# Patient Record
Sex: Male | Born: 1995 | Race: White | Hispanic: No | Marital: Single | Smoking: Former smoker
Health system: Southern US, Community
[De-identification: ages and names within clinical notes are randomized; demographics above are authoritative.]

## PROBLEM LIST (undated history)

## (undated) DIAGNOSIS — J302 Other seasonal allergic rhinitis: Secondary | ICD-10-CM

## (undated) DIAGNOSIS — J45909 Unspecified asthma, uncomplicated: Secondary | ICD-10-CM

## (undated) DIAGNOSIS — F909 Attention-deficit hyperactivity disorder, unspecified type: Secondary | ICD-10-CM

## (undated) DIAGNOSIS — M941 Relapsing polychondritis: Secondary | ICD-10-CM

## (undated) DIAGNOSIS — K900 Celiac disease: Secondary | ICD-10-CM

## (undated) DIAGNOSIS — R1310 Dysphagia, unspecified: Secondary | ICD-10-CM

## (undated) HISTORY — DX: Dysphagia, unspecified: R13.10

## (undated) HISTORY — DX: Relapsing polychondritis: M94.1

---
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
## 2017-07-10 ENCOUNTER — Encounter (HOSPITAL_COMMUNITY): Payer: Self-pay | Admitting: Emergency Medicine

## 2017-07-10 ENCOUNTER — Ambulatory Visit (HOSPITAL_COMMUNITY)
Admission: EM | Admit: 2017-07-10 | Discharge: 2017-07-10 | Disposition: A | Discharge status: Home / Self Care | Payer: 59 | Attending: Family Medicine | Admitting: Family Medicine

## 2017-07-10 ENCOUNTER — Other Ambulatory Visit: Payer: Self-pay

## 2017-07-10 DIAGNOSIS — K219 Gastro-esophageal reflux disease without esophagitis: Secondary | ICD-10-CM | POA: Insufficient documentation

## 2017-07-10 DIAGNOSIS — K900 Celiac disease: Secondary | ICD-10-CM | POA: Diagnosis not present

## 2017-07-10 DIAGNOSIS — Z79899 Other long term (current) drug therapy: Secondary | ICD-10-CM | POA: Diagnosis not present

## 2017-07-10 DIAGNOSIS — Z87891 Personal history of nicotine dependence: Secondary | ICD-10-CM | POA: Insufficient documentation

## 2017-07-10 DIAGNOSIS — Z825 Family history of asthma and other chronic lower respiratory diseases: Secondary | ICD-10-CM | POA: Insufficient documentation

## 2017-07-10 DIAGNOSIS — F909 Attention-deficit hyperactivity disorder, unspecified type: Secondary | ICD-10-CM | POA: Diagnosis not present

## 2017-07-10 DIAGNOSIS — Z8249 Family history of ischemic heart disease and other diseases of the circulatory system: Secondary | ICD-10-CM | POA: Diagnosis not present

## 2017-07-10 DIAGNOSIS — Z113 Encounter for screening for infections with a predominantly sexual mode of transmission: Secondary | ICD-10-CM | POA: Diagnosis not present

## 2017-07-10 DIAGNOSIS — J45909 Unspecified asthma, uncomplicated: Secondary | ICD-10-CM | POA: Diagnosis not present

## 2017-07-10 NOTE — Discharge Instructions (Signed)
We have sent testing for sexually transmitted infections. We will notify you of any positive results once they are received. If required, we will prescribe any medications you might need.  For your acid reflux you may start taking Zantac 150mg  once or twice daily.

## 2017-07-10 NOTE — ED Provider Notes (Signed)
[facility]ARE CENTER   [id number] 07/10/17 Arrival Time: 1134  ASSESSMENT & PLAN:  1. Screen for STD (sexually transmitted disease)   2. Gastroesophageal reflux disease without esophagitis    OTC Zantac for GERD.  Pending: Labs Reviewed  HIV ANTIBODY (ROUTINE TESTING)  RPR  URINE CYTOLOGY ANCILLARY ONLY    Will notify of any positive results.  May f/u with PCP or here as needed. Reviewed expectations re: course of current medical issues. Questions answered. Outlined signs and symptoms indicating need for more acute intervention. Patient verbalized understanding. After Visit Summary given.   SUBJECTIVE:  Melvin Mitchell is a 22 y.o. male who requests STD screening. Reports unprotected intercourse with male partner recently. No symptoms. Afebrile. No abdominal or pelvic pain. No n/v. No rashes or lesions. No h/o STDs reported. Also reports acid reflux symptoms on/off for many months. No self treatment. "Heartburn" after eating large meal. Belching more. No abdominal pain currently.  ROS: As per HPI.  OBJECTIVE:  Vitals:   07/10/17 1208  BP: (!) 126/50  Pulse: 83  Temp: 98.2 F (36.8 C)  TempSrc: Oral  SpO2: 99%     General appearance: alert, cooperative, appears stated age and no distress Throat: lips, mucosa, and tongue normal; teeth and gums normal Back: no CVA tenderness Abdomen: soft, non-tender; bowel sounds normal; no masses or organomegaly GU: declines Skin: warm and dry Psychological:  Alert and cooperative. Normal mood and affect.  Labs Reviewed  HIV ANTIBODY (ROUTINE TESTING)  RPR  URINE CYTOLOGY ANCILLARY ONLY    No Known Allergies  Past Medical History:  Diagnosis Date  . ADHD (attention deficit hyperactivity disorder)   . Asthma    triggered by sensoanl allergies  . Celiac disease   . Dysphagia    Started with choking incident at school  . Seasonal allergies    Spring   Family History  Problem Relation Age of Onset  . Asthma  Sister   . Hyperlipidemia Mother   . Hypertension Mother   . Hyperlipidemia Maternal Grandfather   . Hypertension Maternal Grandfather   . Heart disease Paternal Grandfather    Social History   Socioeconomic History  . Marital status: Single    Spouse name: Not on file  . Number of children: Not on file  . Years of education: Not on file  . Highest education level: Not on file  Occupational History  . Not on file  Social Needs  . Financial resource strain: Not on file  . Food insecurity:    Worry: Not on file    Inability: Not on file  . Transportation needs:    Medical: Not on file    Non-medical: Not on file  Tobacco Use  . Smoking status: Former Smoker    Last attempt to quit: 08/30/2015    Years since quitting: 1.8  . Smokeless tobacco: Former Neurosurgeon    Types: Chew    Quit date: 09/20/2014  Substance and Sexual Activity  . Alcohol use: Yes  . Drug use: No  . Sexual activity: Yes    Birth control/protection: Condom  Lifestyle  . Physical activity:    Days per week: Not on file    Minutes per session: Not on file  . Stress: Not on file  Relationships  . Social connections:    Talks on phone: Not on file    Gets together: Not on file    Attends religious service: Not on file    Active member of club or  organization: Not on file    Attends meetings of clubs or organizations: Not on file    Relationship status: Not on file  . Intimate partner violence:    Fear of current or ex partner: Not on file    Emotionally abused: Not on file    Physically abused: Not on file    Forced sexual activity: Not on file  Other Topics Concern  . Not on file  Social History Narrative   8th grade                   Mardella LaymanHagler, Orah Sonnen, MD 07/10/17 1226

## 2017-07-10 NOTE — ED Triage Notes (Signed)
Pt has celiac disease and has been having acid reflux issues for several months.  Pt also admits to unprotected sex with a girl about a month ago.  He states he has some redness and irritation at the tip of his penis.  He denies any other urinary issues, discharge or dysuria.

## 2017-07-11 LAB — HIV ANTIBODY (ROUTINE TESTING W REFLEX): HIV SCREEN 4TH GENERATION: NONREACTIVE

## 2017-07-11 LAB — URINE CYTOLOGY ANCILLARY ONLY
CHLAMYDIA, DNA PROBE: POSITIVE — AB
Neisseria Gonorrhea: NEGATIVE
Trichomonas: NEGATIVE

## 2017-07-11 LAB — RPR: RPR: NONREACTIVE

## 2017-07-12 ENCOUNTER — Telehealth: Payer: Self-pay | Admitting: Internal Medicine

## 2017-07-12 MED ORDER — AZITHROMYCIN 500 MG PO TABS: 1000.0000 mg | ORAL_TABLET | Freq: Once | ORAL | 0 refills | Status: AC

## 2017-07-12 NOTE — Telephone Encounter (Signed)
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  Prescription for zithromax sent to the pharmacy of record, [facility] on [address]..  Please refrain from sexual intercourse for 7 days to give the medicine time to work.  Sexual partners need to be notified and tested/treated.  Condoms may reduce risk of reinfection.  Recheck or followup with your primary care provider for further evaluation if symptoms are not improving.   LM

## 2017-07-22 ENCOUNTER — Telehealth (HOSPITAL_COMMUNITY): Payer: Self-pay

## 2017-07-22 NOTE — Telephone Encounter (Signed)
Attempt to call patient regarding test results from recent visit and need for new medication. No answer and voicemail not available. Will continue to try to reach patient.

## 2017-07-24 ENCOUNTER — Telehealth (HOSPITAL_COMMUNITY): Payer: Self-pay

## 2017-07-24 NOTE — Telephone Encounter (Signed)
Pt returned call regarding test results. Educated on positive results, need to pick up new prescription and safe sex practices.

## 2017-10-13 ENCOUNTER — Ambulatory Visit: Payer: 59 | Admitting: Adult Health

## 2018-12-30 ENCOUNTER — Ambulatory Visit: Payer: 59 | Admitting: Family Medicine

## 2018-12-30 DIAGNOSIS — Z0289 Encounter for other administrative examinations: Secondary | ICD-10-CM

## 2019-02-23 ENCOUNTER — Emergency Department (HOSPITAL_COMMUNITY): Payer: 59

## 2019-02-23 ENCOUNTER — Encounter (HOSPITAL_COMMUNITY): Payer: Self-pay

## 2019-02-23 ENCOUNTER — Emergency Department (HOSPITAL_COMMUNITY)
Admission: EM | Admit: 2019-02-23 | Discharge: 2019-02-23 | Disposition: A | Discharge status: Home / Self Care | Payer: 59 | Attending: Emergency Medicine | Admitting: Emergency Medicine

## 2019-02-23 ENCOUNTER — Other Ambulatory Visit: Payer: Self-pay

## 2019-02-23 DIAGNOSIS — Y939 Activity, unspecified: Secondary | ICD-10-CM | POA: Diagnosis not present

## 2019-02-23 DIAGNOSIS — J45909 Unspecified asthma, uncomplicated: Secondary | ICD-10-CM | POA: Diagnosis not present

## 2019-02-23 DIAGNOSIS — W010XXA Fall on same level from slipping, tripping and stumbling without subsequent striking against object, initial encounter: Secondary | ICD-10-CM | POA: Diagnosis not present

## 2019-02-23 DIAGNOSIS — M62838 Other muscle spasm: Secondary | ICD-10-CM | POA: Insufficient documentation

## 2019-02-23 DIAGNOSIS — Y999 Unspecified external cause status: Secondary | ICD-10-CM | POA: Insufficient documentation

## 2019-02-23 DIAGNOSIS — Y929 Unspecified place or not applicable: Secondary | ICD-10-CM | POA: Insufficient documentation

## 2019-02-23 DIAGNOSIS — Z79899 Other long term (current) drug therapy: Secondary | ICD-10-CM | POA: Diagnosis not present

## 2019-02-23 DIAGNOSIS — Z87891 Personal history of nicotine dependence: Secondary | ICD-10-CM | POA: Insufficient documentation

## 2019-02-23 LAB — URINALYSIS, ROUTINE W REFLEX MICROSCOPIC
Bilirubin Urine: NEGATIVE
Glucose, UA: NEGATIVE mg/dL
Hgb urine dipstick: NEGATIVE
Ketones, ur: NEGATIVE mg/dL
Leukocytes,Ua: NEGATIVE
Nitrite: NEGATIVE
Protein, ur: NEGATIVE mg/dL
Specific Gravity, Urine: 1.02 (ref 1.005–1.030)
pH: 8 (ref 5.0–8.0)

## 2019-02-23 MED ORDER — METHOCARBAMOL 500 MG PO TABS: 500.0000 mg | ORAL_TABLET | Freq: Four times a day (QID) | ORAL | 0 refills | Status: DC

## 2019-02-23 MED ORDER — HYDROCODONE-ACETAMINOPHEN 5-325 MG PO TABS
2.0000 | ORAL_TABLET | Freq: Once | ORAL | Status: AC
  Administered 2019-02-23: 2 via ORAL
  Filled 2019-02-23: qty 2

## 2019-02-23 MED ORDER — HYDROCODONE-ACETAMINOPHEN 5-325 MG PO TABS: 2.0000 | ORAL_TABLET | Freq: Four times a day (QID) | ORAL | 0 refills | Status: DC | PRN
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
## 2019-02-23 NOTE — ED Notes (Signed)
Pt unable to sign for DC due to broken pad

## 2019-02-23 NOTE — ED Notes (Signed)
Pt attempting to obtain UA sample

## 2019-02-23 NOTE — ED Notes (Signed)
Pt requesting water and medication for headache. PA Aware

## 2019-02-23 NOTE — ED Triage Notes (Signed)
Per [facility] pt from home for muscle spasms in back that caused him to fall. Having back pains. Denies LOC. Vitals: 126/80, 72Hr, 99% on RA and temp 97.8. pt has on c-collar.

## 2019-02-23 NOTE — Discharge Instructions (Signed)
Return if any problems. See your Orthopaedist for recheck in 3-4 days 

## 2019-02-23 NOTE — ED Notes (Signed)
Pt unable to walk at this time due to pain. Pt administered pain medication. Will attempt to ambulate after pain medication

## 2019-02-23 NOTE — ED Notes (Signed)
Pt was able to ambulate in hallway. Pt reported continued pain but was able to ambulate. PA made aware

## 2019-02-23 NOTE — ED Notes (Signed)
C Collar removed by EDP 

## 2019-06-18 ENCOUNTER — Encounter: Payer: Self-pay | Admitting: Family Medicine

## 2019-06-18 ENCOUNTER — Other Ambulatory Visit: Payer: Self-pay

## 2019-06-18 ENCOUNTER — Ambulatory Visit (INDEPENDENT_AMBULATORY_CARE_PROVIDER_SITE_OTHER): Payer: 59 | Admitting: Family Medicine

## 2019-06-18 VITALS — BP 96/72 | HR 76 | Temp 98.8°F | Resp 10 | Ht 70.0 in | Wt 133.2 lb

## 2019-06-18 DIAGNOSIS — K900 Celiac disease: Secondary | ICD-10-CM

## 2019-06-18 DIAGNOSIS — Z113 Encounter for screening for infections with a predominantly sexual mode of transmission: Secondary | ICD-10-CM | POA: Diagnosis not present

## 2019-06-18 LAB — CBC WITH DIFFERENTIAL/PLATELET
Basophils Absolute: 0 10*3/uL (ref 0.0–0.1)
Basophils Relative: 0.8 % (ref 0.0–3.0)
Eosinophils Absolute: 0.1 10*3/uL (ref 0.0–0.7)
Eosinophils Relative: 2.7 % (ref 0.0–5.0)
HCT: 42.4 % (ref 39.0–52.0)
Hemoglobin: 14.7 g/dL (ref 13.0–17.0)
Lymphocytes Relative: 26.1 % (ref 12.0–46.0)
Lymphs Abs: 1.4 10*3/uL (ref 0.7–4.0)
MCHC: 34.7 g/dL (ref 30.0–36.0)
MCV: 92.3 fl (ref 78.0–100.0)
Monocytes Absolute: 0.5 10*3/uL (ref 0.1–1.0)
Monocytes Relative: 9.3 % (ref 3.0–12.0)
Neutro Abs: 3.2 10*3/uL (ref 1.4–7.7)
Neutrophils Relative %: 61.1 % (ref 43.0–77.0)
Platelets: 235 10*3/uL (ref 150.0–400.0)
RBC: 4.59 Mil/uL (ref 4.22–5.81)
RDW: 12.5 % (ref 11.5–15.5)
WBC: 5.3 10*3/uL (ref 4.0–10.5)

## 2019-06-18 LAB — FERRITIN: Ferritin: 77.8 ng/mL (ref 22.0–322.0)

## 2019-06-18 NOTE — Progress Notes (Signed)
HPI   Patient presents today for annual physical and to establish care.  Just got back from [facility].  Likes to throw the football with friends and workout every other day.  Patient has a history of celiacs and states that is getting better about eating.  He states that it has been a month since having any gluten.  Patient states that he drinks soda and sweet tea daily.  Admits having alcohol once to twice a week, 2-3 drinks a setting.  Patient admits that he is sexually active and is okay to have STI exam.  Patient states that he is trying to stop vaping, but reports that he had a puff today.  BP 96/72   Pulse 76   Temp 98.8 F (37.1 C)   Resp 10   Ht 5' 10"  (1.778 m)   Wt 133 lb 4 oz (60.4 kg)   SpO2 97%   BMI 19.12 kg/m   Review of Systems  Constitutional: Negative.   HENT: Negative.   Eyes: Negative.   Respiratory: Negative.   Cardiovascular: Negative.   Gastrointestinal: Positive for heartburn.  Genitourinary: Negative.   Musculoskeletal: Positive for back pain.  Skin: Negative.   Neurological: Negative.   Endo/Heme/Allergies: Positive for environmental allergies.  Psychiatric/Behavioral: Positive for substance abuse.   Plan  Will order a lipid pannel, STI, CBC for today.  Will follow up with patient with results.

## 2019-06-18 NOTE — Patient Instructions (Signed)

## 2019-06-18 NOTE — Progress Notes (Signed)
   Subjective:    Patient ID: Melvin Mitchell, male    DOB: 09-29-1995, 24 y.o.   MRN: [id number]  HPI Chief Complaint  Patient presents with  . Establish Care    previous PCP Mellody Dance  . Annual Exam   This is a 24 yo male who presents today to establish care.  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 Wanting to go to [employer] to study firefighting. Has a dog. Lives alone.   Last CPE- Tdap- Flu- Dental- Eye- Exercise- Vaping- currently trying to quit. Has quit in the past.     Review of Systems     Objective:   Physical Exam    BP 96/72   Pulse 76   Temp 98.8 F (37.1 C)   Resp 10   Ht 5' 10"  (1.778 m)   Wt 133 lb 4 oz (60.4 kg)   SpO2 97%   BMI 19.12 kg/m      Assessment & Plan:

## 2019-06-21 LAB — RPR: RPR Ser Ql: NONREACTIVE

## 2019-06-21 LAB — C. TRACHOMATIS/N. GONORRHOEAE RNA
C. trachomatis RNA, TMA: NOT DETECTED
N. gonorrhoeae RNA, TMA: NOT DETECTED

## 2019-06-21 LAB — HIV ANTIBODY (ROUTINE TESTING W REFLEX): HIV 1&2 Ab, 4th Generation: NONREACTIVE

## 2019-09-08 ENCOUNTER — Other Ambulatory Visit: Payer: Self-pay

## 2019-09-08 ENCOUNTER — Encounter: Payer: Self-pay | Admitting: Emergency Medicine

## 2019-09-08 ENCOUNTER — Ambulatory Visit
Admission: EM | Admit: 2019-09-08 | Discharge: 2019-09-08 | Disposition: A | Discharge status: Home / Self Care | Payer: 59

## 2019-09-08 DIAGNOSIS — H00012 Hordeolum externum right lower eyelid: Secondary | ICD-10-CM | POA: Diagnosis not present

## 2019-09-08 MED ORDER — ERYTHROMYCIN 5 MG/GM OP OINT: TOPICAL_OINTMENT | OPHTHALMIC | 0 refills | Status: DC

## 2019-09-08 NOTE — Discharge Instructions (Addendum)
Erythromycin ointment as directed. Lid scrubs and warm compresses as directed. Monitor for any worsening of symptoms, changes in vision, sensitivity to light, eye swelling, painful eye movement, follow up with ophthalmology for further evaluation.   

## 2019-09-08 NOTE — ED Triage Notes (Signed)
Patient is wearing contacts

## 2019-09-08 NOTE — ED Triage Notes (Signed)
Right lower lid is swelling and is painful.  Redness noted to right lower eye lid.  No known injury.  Symptoms started yesterday

## 2019-09-09 NOTE — ED Provider Notes (Signed)
[name]    CSN: [id number] Arrival date & time: 09/08/19  6761      History   Chief Complaint Chief Complaint  Patient presents with  . Eye Problem    HPI Melvin Mitchell is a 24 y.o. male.   23 year old male comes in for 2 day history of right lower eyelid swelling and pain. States now with some redness to the eyelid. Denies conjunctival injection, vision changes, photophobia. Denies injury/trauma. Wears contacts, glasses.      Past Medical History:  Diagnosis Date  . ADHD (attention deficit hyperactivity disorder)   . Asthma    triggered by sensoanl allergies  . Celiac disease   . Dysphagia    Started with choking incident at school  . Seasonal allergies    Spring    Patient Active Problem List   Diagnosis Date Noted  . Asthma 09/21/2015  . Acute rhinosinusitis 09/20/2015  . Cough 09/20/2015  . Right Monteggia fracture 06/26/2014  . Celiac disease 04/16/2012  . Difficulty swallowing     Past Surgical History:  Procedure Laterality Date  . ESOPHAGOGASTRODUODENOSCOPY  03/27/2012   Procedure: ESOPHAGOGASTRODUODENOSCOPY (EGD);  Surgeon: Oletha Blend, MD;  Location: [facility];  Service: Gastroenterology;  Laterality: N/A;  possible dilatation  . ORIF ELBOW FRACTURE Right 06/27/2014   Procedure: OPEN REDUCTION INTERNAL FIXATION (ORIF) ELBOW/OLECRANON FRACTURE;  Surgeon: Iran Planas, MD;  Location: [facility];  Service: Orthopedics;  Laterality: Right;       Home Medications    Prior to Admission medications   Medication Sig Start Date End Date Taking? Authorizing Provider  cetirizine (ZYRTEC) 10 MG tablet Take 10 mg by mouth daily.   Yes [provider]  erythromycin ophthalmic ointment Place a 1/2 inch ribbon of ointment to affected area 4 times a day for 7 days 09/08/19   Ok Edwards, PA-C  Melatonin 1 MG TABS Take by mouth. As needed, not sure of the exact mg-dose    [provider]    Family History Family History  Problem  Relation Age of Onset  . Hyperlipidemia Mother   . Hypertension Mother   . Diabetes Mother   . Hyperlipidemia Maternal Grandfather   . Hypertension Maternal Grandfather   . Stroke Maternal Grandfather   . Heart disease Paternal Grandfather   . Heart attack Paternal Grandfather   . Dementia Maternal Grandmother     Social History Social History   Tobacco Use  . Smoking status: Former Smoker    Packs/day: 0.25    Types: Cigarettes    Quit date: 08/30/2015    Years since quitting: 4.0  . Smokeless tobacco: Former Systems developer    Types: Chew    Quit date: 09/20/2014  . Tobacco comment: off and on  Substance Use Topics  . Alcohol use: Yes    Comment: once to twice a week  . Drug use: Yes    Types: Marijuana     Allergies   Patient has no known allergies.   Review of Systems Review of Systems  Reason unable to perform ROS: See HPI as above.     Physical Exam Triage Vital Signs ED Triage Vitals  Enc Vitals Group     BP 09/08/19 0944 113/72     Pulse Rate 09/08/19 0944 65     Resp 09/08/19 0944 16     Temp 09/08/19 0944 97.8 F (36.6 C)     Temp Source 09/08/19 0944 Oral     SpO2  09/08/19 0944 99 %     Weight --      Height --      Head Circumference --      Peak Flow --      Pain Score 09/08/19 0941 5     Pain Loc --      Pain Edu? --      Excl. in [place]? --    No data found.  Updated Vital Signs BP 113/72 (BP Location: Left Arm)   Pulse 65   Temp 97.8 F (36.6 C) (Oral)   Resp 16   SpO2 99%   Physical Exam Constitutional:      General: He is not in acute distress.    Appearance: He is well-developed.  HENT:     Head: Normocephalic and atraumatic.  Eyes:     Comments: Hordeolum to the nasal aspect of right lower eyelid. Surrounding swelling with slight erythema to the eyelid. No warmth, induration. Full EOM without pain. Conjunctiva clear. PERRL  Musculoskeletal:     Cervical back: Normal range of motion and neck supple.  Skin:    General: Skin is warm  and dry.  Neurological:     Mental Status: He is alert and oriented to person, place, and time.      UC Treatments / Results  Labs (all labs ordered are listed, but only abnormal results are displayed) Labs Reviewed - No data to display  EKG   Radiology No results found.  Procedures Procedures (including critical care time)  Medications Ordered in UC Medications - No data to display  Initial Impression / Assessment and Plan / UC Course  I have reviewed the triage vital signs and the nursing notes.  Pertinent labs & imaging results that were available during my care of the patient were reviewed by me and considered in my medical decision making (see chart for details).    No signs of preseptal cellulitis at this time. Will start erythromycin ointment. Lids scrubs and warm compress as directed. Return precautions given. Patient expresses understanding and agrees to plan.  Final Clinical Impressions(s) / UC Diagnoses   Final diagnoses:  Hordeolum externum of right lower eyelid    ED Prescriptions    Medication Sig Dispense Auth. Provider   erythromycin ophthalmic ointment Place a 1/2 inch ribbon of ointment to affected area 4 times a day for 7 days 1 g Ok Edwards, PA-C     PDMP not reviewed this encounter.   Ok Edwards, PA-C 09/09/19 [phone]

## 2019-10-20 ENCOUNTER — Other Ambulatory Visit: Payer: Self-pay

## 2019-10-20 ENCOUNTER — Encounter: Payer: Self-pay | Admitting: Emergency Medicine

## 2019-10-20 ENCOUNTER — Ambulatory Visit
Admission: EM | Admit: 2019-10-20 | Discharge: 2019-10-20 | Disposition: A | Discharge status: Home / Self Care | Payer: 59 | Attending: Emergency Medicine | Admitting: Emergency Medicine

## 2019-10-20 DIAGNOSIS — Z113 Encounter for screening for infections with a predominantly sexual mode of transmission: Secondary | ICD-10-CM

## 2019-10-20 NOTE — Discharge Instructions (Addendum)
Give [place] a working phone number so that we can contact you if needed. Refrain from sexual contact until you know your results and your partner(s) are treated if necessary.   Go to www.goodrx.com to look up your medications. This will give you a list of where you can find your prescriptions at the most affordable prices. Or ask the pharmacist what the cash price is, or if they have any other discount programs available to help make your medication more affordable. This can be less expensive than what you would pay with insurance.    Below is a list of primary care practices who are taking new patients for you to follow-up with.  [facility] internal medicine clinic Ground Floor - [facility], [place], [place], [place] [address] [phone]  [facility] at [facility] [address] [place], [place] [address] [phone]  [facility] [place], [place] [address] [phone]  [facility] [phone] [place] [address]  [facility]: [place]  [phone]  [facility]: [place]   [phone]  [facility]: [address] [place]  [phone]  [facility] primary [facility] : [address] [phone]  [facility]: [address] [place] [phone] [phone]  [facility]: [place] [phone]  Dr. Blanchie Serve [place]  [phone]  Dr. Benito Mccreedy, [facility]. [place], [place] [address]  [phone]

## 2019-10-20 NOTE — ED Notes (Signed)
Patient able to ambulate independently  

## 2019-10-20 NOTE — ED Triage Notes (Addendum)
PT presents to [facility] for assessment after having unprotected sex 1 week ago.  Would like STI testing. Denies symptoms.  Denies concerns for HIV or Syphilis.

## 2019-10-20 NOTE — ED Provider Notes (Signed)
HPI  SUBJECTIVE:  Melvin Mitchell is a 24 y.o. male who presents for STD screening.  He has no complaints.  He is requesting testing for gonorrhea, chlamydia, trichomonas.  States that he had unprotected sex with an old girlfriend last week.  He does not know if she is having any symptoms.  He denies having any other partners.  He denies dysuria, urinary complaints, penile rash, discharge, testicular or scrotal pain, swelling, nausea, vomiting, body aches, fevers, back, abdominal, pelvic pain.  He has a past medical history of chlamydia.  No history of gonorrhea, HIV, HSV, syphilis, trichomonas, diabetes.  PMD: None.  Past Medical History:  Diagnosis Date  . ADHD (attention deficit hyperactivity disorder)   . Asthma    triggered by sensoanl allergies  . Celiac disease   . Dysphagia    Started with choking incident at school  . Seasonal allergies    Spring    Past Surgical History:  Procedure Laterality Date  . ESOPHAGOGASTRODUODENOSCOPY  03/27/2012   Procedure: ESOPHAGOGASTRODUODENOSCOPY (EGD);  Surgeon: Oletha Blend, MD;  Location: [place];  Service: Gastroenterology;  Laterality: N/A;  possible dilatation  . ORIF ELBOW FRACTURE Right 06/27/2014   Procedure: OPEN REDUCTION INTERNAL FIXATION (ORIF) ELBOW/OLECRANON FRACTURE;  Surgeon: Iran Planas, MD;  Location: [place];  Service: Orthopedics;  Laterality: Right;    Family History  Problem Relation Age of Onset  . Hyperlipidemia Mother   . Hypertension Mother   . Diabetes Mother   . Hyperlipidemia Maternal Grandfather   . Hypertension Maternal Grandfather   . Stroke Maternal Grandfather   . Heart disease Paternal Grandfather   . Heart attack Paternal Grandfather   . Dementia Maternal Grandmother     Social History   Tobacco Use  . Smoking status: Former Smoker    Packs/day: 0.25    Types: Cigarettes    Quit date: 08/30/2015    Years since quitting: 4.1  . Smokeless tobacco: Former Systems developer    Types: Chew    Quit date:  09/20/2014  . Tobacco comment: off and on  Vaping Use  . Vaping Use: Former  . Quit date: 06/09/2019  Substance Use Topics  . Alcohol use: Yes    Comment: once to twice a week  . Drug use: Yes    Types: Marijuana    No current facility-administered medications for this encounter.  Current Outpatient Medications:  .  cetirizine (ZYRTEC) 10 MG tablet, Take 10 mg by mouth daily., Disp: , Rfl:  .  erythromycin ophthalmic ointment, Place a 1/2 inch ribbon of ointment to affected area 4 times a day for 7 days, Disp: 1 g, Rfl: 0 .  Melatonin 1 MG TABS, Take by mouth. As needed, not sure of the exact mg-dose, Disp: , Rfl:   No Known Allergies   ROS  As noted in HPI.   Physical Exam  BP 113/65 (BP Location: Left Arm)   Pulse 77   Temp 98.4 F (36.9 C) (Oral)   Resp 16   SpO2 95%   Constitutional: Well developed, well nourished, no acute distress Eyes:  EOMI, conjunctiva normal bilaterally HENT: Normocephalic, atraumatic,mucus membranes moist Respiratory: Normal inspiratory effort Cardiovascular: Normal rate GI: nondistended. No suprapubic tenderness Back: No CVAT GU: Declined skin: No rash, skin intact Musculoskeletal: no deformities Neurologic: Alert & oriented x 3, no focal neuro deficits Psychiatric: Speech and behavior appropriate   ED Course   Medications - No data to display  No orders of the defined types were  placed in this encounter.   No results found for this or any previous visit (from the past 24 hour(s)). No results found.  ED Clinical Impression  1. Screening examination for STD (sexually transmitted disease)      ED Assessment/Plan  Sending off gonorrhea, chlamydia, trichomonas. Patient declined testing for HIV and RPR. Advised him that he can get this done for free at the [facility] department. We will call him and treat him appropriately if any of his labs come back abnormal. Follow-up with PMD of choice as needed. Providing primary care  list.   No orders of the defined types were placed in this encounter.   *This clinic note was created using Dragon dictation software. Therefore, there may be occasional mistakes despite careful proofreading.   ?    Melynda Ripple, MD 10/20/19 [phone]

## 2019-10-21 LAB — CYTOLOGY, (ORAL, ANAL, URETHRAL) ANCILLARY ONLY
Chlamydia: NEGATIVE
Comment: NEGATIVE
Comment: NEGATIVE
Comment: NORMAL
Neisseria Gonorrhea: NEGATIVE
Trichomonas: NEGATIVE

## 2019-10-28 ENCOUNTER — Ambulatory Visit
Admission: EM | Admit: 2019-10-28 | Discharge: 2019-10-28 | Disposition: A | Discharge status: Home / Self Care | Payer: 59 | Attending: Emergency Medicine | Admitting: Emergency Medicine

## 2019-10-28 DIAGNOSIS — J069 Acute upper respiratory infection, unspecified: Secondary | ICD-10-CM

## 2019-10-28 MED ORDER — FLUTICASONE PROPIONATE 50 MCG/ACT NA SUSP: 1.0000 | Freq: Every day | NASAL | 0 refills | Status: DC

## 2019-10-28 MED ORDER — AEROCHAMBER PLUS FLO-VU MEDIUM MISC: 1.0000 | Freq: Once | 0 refills | Status: AC

## 2019-10-28 MED ORDER — CETIRIZINE HCL 10 MG PO TABS: 10.0000 mg | ORAL_TABLET | Freq: Every day | ORAL | 0 refills | Status: DC

## 2019-10-28 MED ORDER — PREDNISONE 20 MG PO TABS: 20.0000 mg | ORAL_TABLET | Freq: Every day | ORAL | 0 refills | Status: DC

## 2019-10-28 MED ORDER — ALBUTEROL SULFATE HFA 108 (90 BASE) MCG/ACT IN AERS: 2.0000 | INHALATION_SPRAY | RESPIRATORY_TRACT | 0 refills | Status: DC | PRN

## 2019-10-28 MED ORDER — BENZONATATE 100 MG PO CAPS: 100.0000 mg | ORAL_CAPSULE | Freq: Three times a day (TID) | ORAL | 0 refills | Status: DC

## 2019-10-28 NOTE — ED Provider Notes (Signed)
[name]    CSN: [id number] Arrival date & time: 10/28/19  0810      History   Chief Complaint Chief Complaint  Patient presents with  . Cough    HPI Melvin Mitchell is a 24 y.o. male with history of asthma in childhood presenting for URI symptoms since last night.  Patient provides history: Endorsing productive cough, rhinorrhea, malaise since last night.  Cough is green: No blood.  Denies fever, shortness of breath or wheezing.  Does not have inhaler at home to use.  No chest pain, vomiting, abdominal pain.  No known sick contacts.   Past Medical History:  Diagnosis Date  . ADHD (attention deficit hyperactivity disorder)   . Asthma    triggered by sensoanl allergies  . Celiac disease   . Dysphagia    Started with choking incident at school  . Seasonal allergies    Spring    Patient Active Problem List   Diagnosis Date Noted  . Asthma 09/21/2015  . Acute rhinosinusitis 09/20/2015  . Cough 09/20/2015  . Right Monteggia fracture 06/26/2014  . Celiac disease 04/16/2012  . Difficulty swallowing     Past Surgical History:  Procedure Laterality Date  . ESOPHAGOGASTRODUODENOSCOPY  03/27/2012   Procedure: ESOPHAGOGASTRODUODENOSCOPY (EGD);  Surgeon: Oletha Blend, MD;  Location: [place];  Service: Gastroenterology;  Laterality: N/A;  possible dilatation  . ORIF ELBOW FRACTURE Right 06/27/2014   Procedure: OPEN REDUCTION INTERNAL FIXATION (ORIF) ELBOW/OLECRANON FRACTURE;  Surgeon: Iran Planas, MD;  Location: [place];  Service: Orthopedics;  Laterality: Right;       Home Medications    Prior to Admission medications   Medication Sig Start Date End Date Taking? Authorizing Provider  albuterol (VENTOLIN HFA) 108 (90 Base) MCG/ACT inhaler Inhale 2 puffs into the lungs every 4 (four) hours as needed for wheezing or shortness of breath. 10/28/19   Hall-Potvin, [place], PA-C  benzonatate (TESSALON) 100 MG capsule Take 1 capsule (100 mg total) by mouth every 8  (eight) hours. 10/28/19   Hall-Potvin, [place], PA-C  cetirizine (ZYRTEC) 10 MG tablet Take 1 tablet (10 mg total) by mouth daily. 10/28/19   Hall-Potvin, [place], PA-C  erythromycin ophthalmic ointment Place a 1/2 inch ribbon of ointment to affected area 4 times a day for 7 days 09/08/19   Tasia Catchings, Amy V, PA-C  fluticasone (FLONASE) 50 MCG/ACT nasal spray Place 1 spray into both nostrils daily. 10/28/19   Hall-Potvin, [place], PA-C  Melatonin 1 MG TABS Take by mouth. As needed, not sure of the exact mg-dose    [provider]  predniSONE (DELTASONE) 20 MG tablet Take 1 tablet (20 mg total) by mouth daily. 10/28/19   Hall-Potvin, [place], PA-C  Spacer/Aero-Holding Chambers (AEROCHAMBER PLUS FLO-VU MEDIUM) MISC 1 each by Other route once for 1 dose. 10/28/19 10/28/19  Hall-Potvin, [place], PA-C    Family History Family History  Problem Relation Age of Onset  . Hyperlipidemia Mother   . Hypertension Mother   . Diabetes Mother   . Hyperlipidemia Maternal Grandfather   . Hypertension Maternal Grandfather   . Stroke Maternal Grandfather   . Heart disease Paternal Grandfather   . Heart attack Paternal Grandfather   . Dementia Maternal Grandmother     Social History Social History   Tobacco Use  . Smoking status: Former Smoker    Packs/day: 0.25    Types: Cigarettes    Quit date: 08/30/2015    Years since quitting: 4.1  . Smokeless tobacco: Former Systems developer  Types: Sarina Ser    Quit date: 09/20/2014  . Tobacco comment: off and on  Vaping Use  . Vaping Use: Former  . Quit date: 06/09/2019  Substance Use Topics  . Alcohol use: Yes    Comment: once to twice a week  . Drug use: Yes    Types: Marijuana     Allergies   Patient has no known allergies.   Review of Systems As per HPI   Physical Exam Triage Vital Signs ED Triage Vitals [10/28/19 0818]  Enc Vitals Group     BP 121/73     Pulse Rate 79     Resp 18     Temp 98.1 F (36.7 C)     Temp Source Oral     SpO2 94 %     Weight       Height      Head Circumference      Peak Flow      Pain Score 8     Pain Loc      Pain Edu?      Excl. in [place]?    No data found.  Updated Vital Signs BP 121/73 (BP Location: Left Arm)   Pulse 79   Temp 98.1 F (36.7 C) (Oral)   Resp 18   SpO2 94%   Visual Acuity Right Eye Distance:   Left Eye Distance:   Bilateral Distance:    Right Eye Near:   Left Eye Near:    Bilateral Near:     Physical Exam Constitutional:      General: He is not in acute distress.    Appearance: He is not toxic-appearing or diaphoretic.  HENT:     Head: Normocephalic and atraumatic.     Mouth/Throat:     Mouth: Mucous membranes are moist.     Pharynx: Oropharynx is clear.  Eyes:     General: No scleral icterus.    Conjunctiva/sclera: Conjunctivae normal.     Pupils: Pupils are equal, round, and reactive to light.  Neck:     Comments: Trachea midline, negative JVD Cardiovascular:     Rate and Rhythm: Normal rate and regular rhythm.  Pulmonary:     Effort: Pulmonary effort is normal. No respiratory distress.     Breath sounds: No wheezing or rales.     Comments: Decreased breath sounds bilaterally Musculoskeletal:     Cervical back: Neck supple. No tenderness.  Lymphadenopathy:     Cervical: No cervical adenopathy.  Skin:    Capillary Refill: Capillary refill takes less than 2 seconds.     Coloration: Skin is not jaundiced or pale.     Findings: No rash.  Neurological:     Mental Status: He is alert and oriented to person, place, and time.      UC Treatments / Results  Labs (all labs ordered are listed, but only abnormal results are displayed) Labs Reviewed  NOVEL CORONAVIRUS, NAA    EKG   Radiology No results found.  Procedures Procedures (including critical care time)  Medications Ordered in UC Medications - No data to display  Initial Impression / Assessment and Plan / UC Course  I have reviewed the triage vital signs and the nursing notes.  Pertinent  labs & imaging results that were available during my care of the patient were reviewed by me and considered in my medical decision making (see chart for details).     Patient afebrile, nontoxic, with SpO2 94%.  Covid PCR pending.  Patient  to quarantine until results are back.  We will treat supportively as outlined below.  Return precautions discussed, patient verbalized understanding and is agreeable to plan. Final Clinical Impressions(s) / UC Diagnoses   Final diagnoses:  URI with cough and congestion     Discharge Instructions     Your COVID test is pending - it is important to quarantine / isolate at home until your results are back. If you test positive and would like further evaluation for persistent or worsening symptoms, you may schedule an E-visit or virtual (video) visit throughout the [facility] app or website.  PLEASE NOTE: If you develop severe chest pain or shortness of breath please go to the ER or call 9-1-1 for further evaluation --> DO NOT schedule electronic or virtual visits for this. Please call our office for further guidance / recommendations as needed.  For information about the Covid vaccine, please visit [email or web address]    ED Prescriptions    Medication Sig Dispense Auth. Provider   cetirizine (ZYRTEC) 10 MG tablet Take 1 tablet (10 mg total) by mouth daily. 30 tablet Hall-Potvin, [place], PA-C   fluticasone (FLONASE) 50 MCG/ACT nasal spray Place 1 spray into both nostrils daily. 16 g Hall-Potvin, [place], PA-C   benzonatate (TESSALON) 100 MG capsule Take 1 capsule (100 mg total) by mouth every 8 (eight) hours. 21 capsule Hall-Potvin, [place], PA-C   albuterol (VENTOLIN HFA) 108 (90 Base) MCG/ACT inhaler Inhale 2 puffs into the lungs every 4 (four) hours as needed for wheezing or shortness of breath. 18 g Hall-Potvin, [place], PA-C   Spacer/Aero-Holding Chambers (AEROCHAMBER PLUS FLO-VU MEDIUM) MISC 1 each by Other route once for 1 dose. 1  each Hall-Potvin, [place], PA-C   predniSONE (DELTASONE) 20 MG tablet Take 1 tablet (20 mg total) by mouth daily. 5 tablet Hall-Potvin, [place], PA-C     PDMP not reviewed this encounter.   Hall-Potvin, [place], [place] 10/28/19 [phone]

## 2019-10-28 NOTE — ED Triage Notes (Signed)
Pt c/o productive cough with greens sputum, chest congestion, and runny nose since last night.

## 2019-10-28 NOTE — Discharge Instructions (Signed)
Your COVID test is pending - it is important to quarantine / isolate at home until your results are back. °If you test positive and would like further evaluation for persistent or worsening symptoms, you may schedule an E-visit or virtual (video) visit throughout the [facility] MyChart app or website. ° °PLEASE NOTE: If you develop severe chest pain or shortness of breath please go to the ER or call 9-1-1 for further evaluation --> DO NOT schedule electronic or virtual visits for this. °Please call our office for further guidance / recommendations as needed. ° °For information about the Covid vaccine, please visit [email or web address] °

## 2019-10-29 LAB — SARS-COV-2, NAA 2 DAY TAT

## 2019-10-29 LAB — NOVEL CORONAVIRUS, NAA: SARS-CoV-2, NAA: NOT DETECTED

## 2019-11-02 ENCOUNTER — Ambulatory Visit
Admission: EM | Admit: 2019-11-02 | Discharge: 2019-11-02 | Disposition: A | Discharge status: Home / Self Care | Payer: 59 | Attending: Emergency Medicine | Admitting: Emergency Medicine

## 2019-11-02 ENCOUNTER — Encounter: Payer: Self-pay | Admitting: Emergency Medicine

## 2019-11-02 ENCOUNTER — Other Ambulatory Visit: Payer: Self-pay

## 2019-11-02 ENCOUNTER — Ambulatory Visit (INDEPENDENT_AMBULATORY_CARE_PROVIDER_SITE_OTHER): Payer: 59

## 2019-11-02 DIAGNOSIS — R05 Cough: Secondary | ICD-10-CM

## 2019-11-02 DIAGNOSIS — R0989 Other specified symptoms and signs involving the circulatory and respiratory systems: Secondary | ICD-10-CM | POA: Diagnosis not present

## 2019-11-02 DIAGNOSIS — R059 Cough, unspecified: Secondary | ICD-10-CM

## 2019-11-02 MED ORDER — BENZONATATE 100 MG PO CAPS: 100.0000 mg | ORAL_CAPSULE | Freq: Three times a day (TID) | ORAL | 0 refills | Status: DC

## 2019-11-02 MED ORDER — AZITHROMYCIN 250 MG PO TABS: 250.0000 mg | ORAL_TABLET | Freq: Every day | ORAL | 0 refills | Status: DC

## 2019-11-02 NOTE — ED Triage Notes (Signed)
Provider assessment occurred prior to RN triage.  PLease see provider note .

## 2019-11-02 NOTE — Discharge Instructions (Signed)
Take imatinib as prescribed. Important to use your spacer with your inhaler. Recommend you follow-up with your primary care in 1 week for repeat evaluation as you may need referral to a specialist. Go to ER for worsening cough, chest pain, difficulty breathing.

## 2019-11-02 NOTE — ED Provider Notes (Signed)
[name]    CSN: [id number] Arrival date & time: 11/02/19  0936      History   Chief Complaint Chief Complaint  Patient presents with  . Cough    HPI Melvin Mitchell is a 24 y.o. male with history of celiac disease, asthma, allergies presenting for persistent cough.  Patient previously seen by me for this on 7/8: Covid negative, given supportive treatment.  Since then, patient states since then, he has completed prednisone, been using albuterol inhaler without spacer every 4 hours as needed.  Has developed some right costal margin tenderness from coughing.  Denies shortness of breath, palpitations, fever.  Past Medical History:  Diagnosis Date  . ADHD (attention deficit hyperactivity disorder)   . Asthma    triggered by sensoanl allergies  . Celiac disease   . Dysphagia    Started with choking incident at school  . Seasonal allergies    Spring    Patient Active Problem List   Diagnosis Date Noted  . Asthma 09/21/2015  . Acute rhinosinusitis 09/20/2015  . Cough 09/20/2015  . Right Monteggia fracture 06/26/2014  . Celiac disease 04/16/2012  . Difficulty swallowing     Past Surgical History:  Procedure Laterality Date  . ESOPHAGOGASTRODUODENOSCOPY  03/27/2012   Procedure: ESOPHAGOGASTRODUODENOSCOPY (EGD);  Surgeon: Oletha Blend, MD;  Location: [place];  Service: Gastroenterology;  Laterality: N/A;  possible dilatation  . ORIF ELBOW FRACTURE Right 06/27/2014   Procedure: OPEN REDUCTION INTERNAL FIXATION (ORIF) ELBOW/OLECRANON FRACTURE;  Surgeon: Iran Planas, MD;  Location: [place];  Service: Orthopedics;  Laterality: Right;       Home Medications    Prior to Admission medications   Medication Sig Start Date End Date Taking? Authorizing Provider  albuterol (VENTOLIN HFA) 108 (90 Base) MCG/ACT inhaler Inhale 2 puffs into the lungs every 4 (four) hours as needed for wheezing or shortness of breath. 10/28/19   Hall-Potvin, [place], PA-C  azithromycin  (ZITHROMAX) 250 MG tablet Take 1 tablet (250 mg total) by mouth daily. Take first 2 tablets together, then 1 every day until finished. 11/02/19   Hall-Potvin, [place], PA-C  benzonatate (TESSALON) 100 MG capsule Take 1 capsule (100 mg total) by mouth every 8 (eight) hours. 10/28/19   Hall-Potvin, [place], PA-C  cetirizine (ZYRTEC) 10 MG tablet Take 1 tablet (10 mg total) by mouth daily. 10/28/19   Hall-Potvin, [place], PA-C  erythromycin ophthalmic ointment Place a 1/2 inch ribbon of ointment to affected area 4 times a day for 7 days 09/08/19   Tasia Catchings, Amy V, PA-C  fluticasone (FLONASE) 50 MCG/ACT nasal spray Place 1 spray into both nostrils daily. 10/28/19   Hall-Potvin, [place], PA-C  Melatonin 1 MG TABS Take by mouth. As needed, not sure of the exact mg-dose    [provider]    Family History Family History  Problem Relation Age of Onset  . Hyperlipidemia Mother   . Hypertension Mother   . Diabetes Mother   . Hyperlipidemia Maternal Grandfather   . Hypertension Maternal Grandfather   . Stroke Maternal Grandfather   . Heart disease Paternal Grandfather   . Heart attack Paternal Grandfather   . Dementia Maternal Grandmother     Social History Social History   Tobacco Use  . Smoking status: Former Smoker    Packs/day: 0.25    Types: Cigarettes    Quit date: 08/30/2015    Years since quitting: 4.1  . Smokeless tobacco: Former Systems developer    Types: Loss adjuster, chartered  Quit date: 09/20/2014  . Tobacco comment: off and on  Vaping Use  . Vaping Use: Former  . Quit date: 06/09/2019  Substance Use Topics  . Alcohol use: Yes    Comment: once to twice a week  . Drug use: Yes    Types: Marijuana     Allergies   Patient has no known allergies.   Review of Systems As per HPI   Physical Exam Triage Vital Signs ED Triage Vitals  Enc Vitals Group     BP      Pulse      Resp      Temp      Temp src      SpO2      Weight      Height      Head Circumference      Peak Flow      Pain  Score      Pain Loc      Pain Edu?      Excl. in [place]?    No data found.  Updated Vital Signs BP 114/78 (BP Location: Left Arm)   Pulse 78   Temp 98.1 F (36.7 C) (Oral)   Resp 18   SpO2 97%   Visual Acuity Right Eye Distance:   Left Eye Distance:   Bilateral Distance:    Right Eye Near:   Left Eye Near:    Bilateral Near:     Physical Exam Constitutional:      General: He is not in acute distress.    Appearance: He is not toxic-appearing or diaphoretic.  HENT:     Head: Normocephalic and atraumatic.     Mouth/Throat:     Mouth: Mucous membranes are moist.     Pharynx: Oropharynx is clear.  Eyes:     General: No scleral icterus.    Conjunctiva/sclera: Conjunctivae normal.     Pupils: Pupils are equal, round, and reactive to light.  Neck:     Comments: Trachea midline, negative JVD Cardiovascular:     Rate and Rhythm: Normal rate and regular rhythm.  Pulmonary:     Effort: Pulmonary effort is normal. No respiratory distress.     Breath sounds: Wheezing and rhonchi present.     Comments: No prolonged expiratory phase. Musculoskeletal:     Cervical back: Neck supple. No tenderness.  Lymphadenopathy:     Cervical: No cervical adenopathy.  Skin:    Capillary Refill: Capillary refill takes less than 2 seconds.     Coloration: Skin is not jaundiced or pale.     Findings: No rash.  Neurological:     Mental Status: He is alert and oriented to person, place, and time.      UC Treatments / Results  Labs (all labs ordered are listed, but only abnormal results are displayed) Labs Reviewed - No data to display  EKG   Radiology DG Chest 2 View  Result Date: 11/02/2019 CLINICAL DATA:  Continued chest congestion with cough for 1 week EXAM: CHEST - 2 VIEW COMPARISON:  03/02/2012 FINDINGS: Normal heart size and mediastinal contours. No acute infiltrate or edema. No effusion or pneumothorax. No acute osseous findings. IMPRESSION: Negative chest. Electronically Signed    By: Monte Fantasia M.D.   On: 11/02/2019 09:56    Procedures Procedures (including critical care time)  Medications Ordered in UC Medications - No data to display  Initial Impression / Assessment and Plan / UC Course  I have reviewed the triage vital signs  and the nursing notes.  Pertinent labs & imaging results that were available during my care of the patient were reviewed by me and considered in my medical decision making (see chart for details).     Patient febrile, nontoxic in office today.  No respiratory distress and hemodynamically stable.  Chest x-ray done office, reviewed by me radiology: Negative for acute cardiopulmonary process.  Will start Z-Pak given patient's comorbidities and have him follow-up with PCP in 1 week.  Return precautions discussed, patient verbalized understanding and is agreeable to plan. Final Clinical Impressions(s) / UC Diagnoses   Final diagnoses:  Cough     Discharge Instructions     Take imatinib as prescribed. Important to use your spacer with your inhaler. Recommend you follow-up with your primary care in 1 week for repeat evaluation as you may need referral to a specialist. Go to ER for worsening cough, chest pain, difficulty breathing.    ED Prescriptions    Medication Sig Dispense Auth. Provider   azithromycin (ZITHROMAX) 250 MG tablet Take 1 tablet (250 mg total) by mouth daily. Take first 2 tablets together, then 1 every day until finished. 6 tablet Hall-Potvin, [place], PA-C     PDMP not reviewed this encounter.   Hall-Potvin, [place], [place] 11/02/19 1006

## 2019-11-24 ENCOUNTER — Other Ambulatory Visit: Payer: Self-pay

## 2019-11-24 ENCOUNTER — Ambulatory Visit
Admission: EM | Admit: 2019-11-24 | Discharge: 2019-11-24 | Disposition: A | Discharge status: Home / Self Care | Payer: 59 | Attending: Emergency Medicine | Admitting: Emergency Medicine

## 2019-11-24 DIAGNOSIS — H60311 Diffuse otitis externa, right ear: Secondary | ICD-10-CM

## 2019-11-24 DIAGNOSIS — H9203 Otalgia, bilateral: Secondary | ICD-10-CM

## 2019-11-24 MED ORDER — NEOMYCIN-POLYMYXIN-HC 3.5-10000-1 OT SOLN: 3.0000 [drp] | Freq: Three times a day (TID) | OTIC | 0 refills | Status: DC

## 2019-11-24 NOTE — Discharge Instructions (Signed)
Use eardrops as prescribed for the next week. Return for worsening ear pain, swelling, discharge, bleeding, decreased hearing, development of jaw pain/swelling, fever.  Do NOT use Q-tips as these can cause your ear wax to get stuck, the tips may break off and become a foreign body requiring additional medical care, or puncture your eardrum.  Helpful prevention tip: Use a solution of equal parts isopropyl (rubbing) alcohol and white vinegar (acetic acid) in both ears after swimming. 

## 2019-11-24 NOTE — ED Triage Notes (Signed)
Pt c/o ear fullness since Saturday after tubing and going under water. States now having severe pain in both ears and rt ear blocked.

## 2019-11-24 NOTE — ED Provider Notes (Signed)
[name]    CSN: [id number] Arrival date & time: 11/24/19  1734      History   Chief Complaint Chief Complaint  Patient presents with  . Otalgia    HPI Melvin Mitchell is a 24 y.o. male presenting for bilateral ear aching (R>L) for the last few days.  Denies foreign body sensation or exposure.  States that it occurred after to being on Saturday: Spent a lot of time under water.  No headache, difficulty breathing or swallowing, chest pain.  Does endorse decreased hearing in right.  No tinnitus or dizziness.    Past Medical History:  Diagnosis Date  . ADHD (attention deficit hyperactivity disorder)   . Asthma    triggered by sensoanl allergies  . Celiac disease   . Dysphagia    Started with choking incident at school  . Seasonal allergies    Spring    Patient Active Problem List   Diagnosis Date Noted  . Asthma 09/21/2015  . Acute rhinosinusitis 09/20/2015  . Cough 09/20/2015  . Right Monteggia fracture 06/26/2014  . Celiac disease 04/16/2012  . Difficulty swallowing     Past Surgical History:  Procedure Laterality Date  . ESOPHAGOGASTRODUODENOSCOPY  03/27/2012   Procedure: ESOPHAGOGASTRODUODENOSCOPY (EGD);  Surgeon: Oletha Blend, MD;  Location: [place];  Service: Gastroenterology;  Laterality: N/A;  possible dilatation  . ORIF ELBOW FRACTURE Right 06/27/2014   Procedure: OPEN REDUCTION INTERNAL FIXATION (ORIF) ELBOW/OLECRANON FRACTURE;  Surgeon: Iran Planas, MD;  Location: [place];  Service: Orthopedics;  Laterality: Right;       Home Medications    Prior to Admission medications   Medication Sig Start Date End Date Taking? Authorizing Provider  albuterol (VENTOLIN HFA) 108 (90 Base) MCG/ACT inhaler Inhale 2 puffs into the lungs every 4 (four) hours as needed for wheezing or shortness of breath. 10/28/19   Hall-Potvin, [place], PA-C  Melatonin 1 MG TABS Take by mouth. As needed, not sure of the exact mg-dose    [provider]    neomycin-polymyxin-hydrocortisone (CORTISPORIN) OTIC solution Place 3 drops into both ears 3 (three) times daily. 11/24/19   Hall-Potvin, [place], PA-C    Family History Family History  Problem Relation Age of Onset  . Hyperlipidemia Mother   . Hypertension Mother   . Diabetes Mother   . Hyperlipidemia Maternal Grandfather   . Hypertension Maternal Grandfather   . Stroke Maternal Grandfather   . Heart disease Paternal Grandfather   . Heart attack Paternal Grandfather   . Dementia Maternal Grandmother     Social History Social History   Tobacco Use  . Smoking status: Former Smoker    Packs/day: 0.25    Types: Cigarettes, E-cigarettes    Quit date: 08/30/2015    Years since quitting: 4.2  . Smokeless tobacco: Former Systems developer    Types: Chew    Quit date: 09/20/2014  . Tobacco comment: off and on  Vaping Use  . Vaping Use: Former  . Quit date: 06/09/2019  Substance Use Topics  . Alcohol use: Yes    Comment: once to twice a week  . Drug use: Not Currently     Allergies   Patient has no known allergies.   Review of Systems As per HPI   Physical Exam Triage Vital Signs ED Triage Vitals  Enc Vitals Group     BP 11/24/19 1817 106/62     Pulse Rate 11/24/19 1817 71     Resp 11/24/19 1817 18  Temp 11/24/19 1817 97.8 F (36.6 C)     Temp Source 11/24/19 1817 Oral     SpO2 11/24/19 1817 98 %     Weight --      Height --      Head Circumference --      Peak Flow --      Pain Score 11/24/19 1818 10     Pain Loc --      Pain Edu? --      Excl. in [place]? --    No data found.  Updated Vital Signs BP 106/62 (BP Location: Left Arm)   Pulse 71   Temp 97.8 F (36.6 C) (Oral)   Resp 18   SpO2 98%   Visual Acuity Right Eye Distance:   Left Eye Distance:   Bilateral Distance:    Right Eye Near:   Left Eye Near:    Bilateral Near:     Physical Exam Constitutional:      General: He is not in acute distress. HENT:     Head: Normocephalic and atraumatic.      Left Ear: Tympanic membrane, ear canal and external ear normal.     Ears:     Comments: Tragal tenderness bilaterally (R>L).  Right EAC with mild amount of discharge and moderate erythema: TM intact.  Hearing decreased in R as compared to L Eyes:     General: No scleral icterus.    Pupils: Pupils are equal, round, and reactive to light.  Cardiovascular:     Rate and Rhythm: Normal rate.  Pulmonary:     Effort: Pulmonary effort is normal. No respiratory distress.     Breath sounds: No wheezing.  Skin:    Coloration: Skin is not jaundiced or pale.  Neurological:     Mental Status: He is alert and oriented to person, place, and time.      UC Treatments / Results  Labs (all labs ordered are listed, but only abnormal results are displayed) Labs Reviewed - No data to display  EKG   Radiology No results found.  Procedures Procedures (including critical care time)  Medications Ordered in UC Medications - No data to display  Initial Impression / Assessment and Plan / UC Course  I have reviewed the triage vital signs and the nursing notes.  Pertinent labs & imaging results that were available during my care of the patient were reviewed by me and considered in my medical decision making (see chart for details).     Patient with swimmer's ear in right ear, which she has decreased hearing in.  We will have him follow-up with ENT tomorrow via phone, start Cortisporin drops today.  Return precautions discussed, pt verbalized understanding and is agreeable to plan. Final Clinical Impressions(s) / UC Diagnoses   Final diagnoses:  Acute ear pain, bilateral  Acute diffuse otitis externa of right ear     Discharge Instructions     Use eardrops as prescribed for the next week. Return for worsening ear pain, swelling, discharge, bleeding, decreased hearing, development of jaw pain/swelling, fever.  Do NOT use Q-tips as these can cause your ear wax to get stuck, the tips may break  off and become a foreign body requiring additional medical care, or puncture your eardrum.  Helpful prevention tip: Use a solution of equal parts isopropyl (rubbing) alcohol and white vinegar (acetic acid) in both ears after swimming.    ED Prescriptions    Medication Sig Dispense Auth. Provider   neomycin-polymyxin-hydrocortisone (CORTISPORIN)  OTIC solution Place 3 drops into both ears 3 (three) times daily. 10 mL Hall-Potvin, [place], PA-C     PDMP not reviewed this encounter.   Neldon Mc [place], [place] 11/24/19 1857

## 2019-11-26 DIAGNOSIS — H6121 Impacted cerumen, right ear: Secondary | ICD-10-CM | POA: Insufficient documentation

## 2019-11-26 DIAGNOSIS — H9011 Conductive hearing loss, unilateral, right ear, with unrestricted hearing on the contralateral side: Secondary | ICD-10-CM | POA: Insufficient documentation

## 2019-11-26 DIAGNOSIS — H60503 Unspecified acute noninfective otitis externa, bilateral: Secondary | ICD-10-CM

## 2019-11-26 HISTORY — DX: Unspecified acute noninfective otitis externa, bilateral: H60.503

## 2019-12-20 ENCOUNTER — Ambulatory Visit: Payer: 59 | Admitting: Family Medicine

## 2020-04-11 ENCOUNTER — Ambulatory Visit
Admission: EM | Admit: 2020-04-11 | Discharge: 2020-04-11 | Disposition: A | Discharge status: Home / Self Care | Payer: 59 | Attending: Emergency Medicine | Admitting: Emergency Medicine

## 2020-04-11 ENCOUNTER — Encounter: Payer: Self-pay | Admitting: *Deleted

## 2020-04-11 ENCOUNTER — Other Ambulatory Visit: Payer: Self-pay

## 2020-04-11 DIAGNOSIS — R52 Pain, unspecified: Secondary | ICD-10-CM | POA: Diagnosis not present

## 2020-04-11 DIAGNOSIS — R0981 Nasal congestion: Secondary | ICD-10-CM | POA: Diagnosis not present

## 2020-04-11 MED ORDER — IBUPROFEN 600 MG PO TABS: 600.0000 mg | ORAL_TABLET | Freq: Four times a day (QID) | ORAL | 0 refills | Status: DC | PRN

## 2020-04-11 MED ORDER — FLUTICASONE PROPIONATE 50 MCG/ACT NA SUSP: 1.0000 | Freq: Every day | NASAL | 0 refills | Status: DC

## 2020-04-11 MED ORDER — DM-GUAIFENESIN ER 30-600 MG PO TB12: 1.0000 | ORAL_TABLET | Freq: Two times a day (BID) | ORAL | 0 refills | Status: DC

## 2020-04-11 NOTE — Discharge Instructions (Addendum)
Covid test pending, monitor my chart for results Begin Flonase nasal spray to help with sinus congestion and pressure Mucinex DM to further help relieve cough and congestion Ibuprofen and Tylenol for body aches, headaches, fevers Please follow-up if not improving or worsening

## 2020-04-11 NOTE — ED Triage Notes (Signed)
Patient reports sinus congestion, headache and generalized body aches that started today. Reports got short of breath at work. Started taking some tylenol cold and flu this afternoon.

## 2020-04-11 NOTE — ED Provider Notes (Signed)
[name]    CSN: [id number] Arrival date & time: 04/11/20  1638      History   Chief Complaint Chief Complaint  Patient presents with  . Nasal Congestion  . Headache  . Generalized Body Aches    HPI Melvin Mitchell is a 24 y.o. male presenting today for evaluation of sinus congestion and body aches.  Reports symptoms started today with waking up.  Denies any known fevers.  Reports very mild sinus congestion and pressure with headache.  Denies significant sore throat.  Denies any known close sick contacts or Covid exposures.  Denies cough, but does feel some mucus in chest.  HPI  Past Medical History:  Diagnosis Date  . ADHD (attention deficit hyperactivity disorder)   . Asthma    triggered by sensoanl allergies  . Celiac disease   . Dysphagia    Started with choking incident at school  . Seasonal allergies    Spring    Patient Active Problem List   Diagnosis Date Noted  . Asthma 09/21/2015  . Acute rhinosinusitis 09/20/2015  . Cough 09/20/2015  . Right Monteggia fracture 06/26/2014  . Celiac disease 04/16/2012  . Difficulty swallowing     Past Surgical History:  Procedure Laterality Date  . ESOPHAGOGASTRODUODENOSCOPY  03/27/2012   Procedure: ESOPHAGOGASTRODUODENOSCOPY (EGD);  Surgeon: Oletha Blend, MD;  Location: [place];  Service: Gastroenterology;  Laterality: N/A;  possible dilatation  . ORIF ELBOW FRACTURE Right 06/27/2014   Procedure: OPEN REDUCTION INTERNAL FIXATION (ORIF) ELBOW/OLECRANON FRACTURE;  Surgeon: Iran Planas, MD;  Location: [place];  Service: Orthopedics;  Laterality: Right;       Home Medications    Prior to Admission medications   Medication Sig Start Date End Date Taking? Authorizing Provider  albuterol (VENTOLIN HFA) 108 (90 Base) MCG/ACT inhaler Inhale 2 puffs into the lungs every 4 (four) hours as needed for wheezing or shortness of breath. 10/28/19   Hall-Potvin, [place], PA-C  dextromethorphan-guaiFENesin (MUCINEX  DM) 30-600 MG 12hr tablet Take 1 tablet by mouth 2 (two) times daily. 04/11/20   Shaima Sardinas C, PA-C  fluticasone (FLONASE) 50 MCG/ACT nasal spray Place 1-2 sprays into both nostrils daily. 04/11/20   Starkeisha Vanwinkle C, PA-C  ibuprofen (ADVIL) 600 MG tablet Take 1 tablet (600 mg total) by mouth every 6 (six) hours as needed. 04/11/20   Takeira Yanes C, PA-C  Melatonin 1 MG TABS Take by mouth. As needed, not sure of the exact mg-dose    [provider]  neomycin-polymyxin-hydrocortisone (CORTISPORIN) OTIC solution Place 3 drops into both ears 3 (three) times daily. 11/24/19   Hall-Potvin, [place], PA-C    Family History Family History  Problem Relation Age of Onset  . Hyperlipidemia Mother   . Hypertension Mother   . Diabetes Mother   . Hyperlipidemia Maternal Grandfather   . Hypertension Maternal Grandfather   . Stroke Maternal Grandfather   . Heart disease Paternal Grandfather   . Heart attack Paternal Grandfather   . Dementia Maternal Grandmother     Social History Social History   Tobacco Use  . Smoking status: Former Smoker    Packs/day: 0.25    Types: Cigarettes, E-cigarettes    Quit date: 08/30/2015    Years since quitting: 4.6  . Smokeless tobacco: Former Systems developer    Types: Chew    Quit date: 09/20/2014  . Tobacco comment: off and on  Vaping Use  . Vaping Use: Former  . Quit date: 06/09/2019  Substance Use Topics  .  Alcohol use: Yes    Comment: once to twice a week  . Drug use: Not Currently     Allergies   Patient has no known allergies.   Review of Systems Review of Systems  Constitutional: Negative for activity change, appetite change, chills, fatigue and fever.  HENT: Positive for congestion, rhinorrhea and sinus pressure. Negative for ear pain, sore throat and trouble swallowing.   Eyes: Negative for discharge and redness.  Respiratory: Negative for cough, chest tightness and shortness of breath.   Cardiovascular: Negative for chest pain.   Gastrointestinal: Negative for abdominal pain, diarrhea, nausea and vomiting.  Musculoskeletal: Positive for myalgias.  Skin: Negative for rash.  Neurological: Positive for headaches. Negative for dizziness and light-headedness.     Physical Exam Triage Vital Signs ED Triage Vitals  Enc Vitals Group     BP 04/11/20 1721 111/67     Pulse Rate 04/11/20 1721 70     Resp 04/11/20 1721 16     Temp 04/11/20 1721 97.9 F (36.6 C)     Temp Source 04/11/20 1721 Oral     SpO2 04/11/20 1721 97 %     Weight --      Height --      Head Circumference --      Peak Flow --      Pain Score 04/11/20 1720 4     Pain Loc --      Pain Edu? --      Excl. in [place]? --    No data found.  Updated Vital Signs BP 111/67 (BP Location: Left Arm)   Pulse 70   Temp 97.9 F (36.6 C) (Oral)   Resp 16   SpO2 97%   Visual Acuity Right Eye Distance:   Left Eye Distance:   Bilateral Distance:    Right Eye Near:   Left Eye Near:    Bilateral Near:     Physical Exam Vitals and nursing note reviewed.  Constitutional:      Appearance: He is well-developed and well-nourished.     Comments: No acute distress  HENT:     Head: Normocephalic and atraumatic.     Ears:     Comments: Bilateral ears without tenderness to palpation of external auricle, tragus and mastoid, EAC's without erythema or swelling, TM's with good bony landmarks and cone of light. Non erythematous.     Nose: Nose normal.     Mouth/Throat:     Comments: Oral mucosa pink and moist, no tonsillar enlargement or exudate. Posterior pharynx patent and nonerythematous, no uvula deviation or swelling. Normal phonation. Eyes:     Conjunctiva/sclera: Conjunctivae normal.  Cardiovascular:     Rate and Rhythm: Normal rate and regular rhythm.  Pulmonary:     Effort: Pulmonary effort is normal. No respiratory distress.     Comments: Breathing comfortably at rest, CTABL, no wheezing, rales or other adventitious sounds auscultated Abdominal:      General: There is no distension.  Musculoskeletal:        General: Normal range of motion.     Cervical back: Neck supple.  Skin:    General: Skin is warm and dry.  Neurological:     Mental Status: He is alert and oriented to person, place, and time.  Psychiatric:        Mood and Affect: Mood and affect normal.      UC Treatments / Results  Labs (all labs ordered are listed, but only abnormal results are displayed) Labs  Reviewed  NOVEL CORONAVIRUS, NAA    EKG   Radiology No results found.  Procedures Procedures (including critical care time)  Medications Ordered in UC Medications - No data to display  Initial Impression / Assessment and Plan / UC Course  I have reviewed the triage vital signs and the nursing notes.  Pertinent labs & imaging results that were available during my care of the patient were reviewed by me and considered in my medical decision making (see chart for details).     Covid test pending-1 day of nasal congestion and body aches, suspect viral etiology and recommend symptomatic and supportive care with close monitoring.  Rest and fluids.  Discussed strict return precautions. Patient verbalized understanding and is agreeable with plan.  Final Clinical Impressions(s) / UC Diagnoses   Final diagnoses:  Generalized body aches  Sinus congestion     Discharge Instructions     Covid test pending, monitor my chart for results Begin Flonase nasal spray to help with sinus congestion and pressure Mucinex DM to further help relieve cough and congestion Ibuprofen and Tylenol for body aches, headaches, fevers Please follow-up if not improving or worsening    ED Prescriptions    Medication Sig Dispense Auth. Provider   ibuprofen (ADVIL) 600 MG tablet Take 1 tablet (600 mg total) by mouth every 6 (six) hours as needed. 30 tablet Danni Shima C, PA-C   fluticasone (FLONASE) 50 MCG/ACT nasal spray Place 1-2 sprays into both nostrils daily. 16 g  Jerimiah Wolman C, PA-C   dextromethorphan-guaiFENesin (MUCINEX DM) 30-600 MG 12hr tablet Take 1 tablet by mouth 2 (two) times daily. 20 tablet Raeya Merritts, [place] C, PA-C     PDMP not reviewed this encounter.   Janith Lima, [place] 04/11/20 1825

## 2020-04-13 LAB — SARS-COV-2, NAA 2 DAY TAT

## 2020-04-13 LAB — NOVEL CORONAVIRUS, NAA: SARS-CoV-2, NAA: DETECTED — AB

## 2020-04-16 ENCOUNTER — Telehealth: Payer: Self-pay | Admitting: Family

## 2020-04-16 NOTE — Telephone Encounter (Signed)
Called to Discuss with patient about Covid symptoms and the use of the monoclonal antibody infusion for those with mild to moderate Covid symptoms and at a high risk of hospitalization.     Pt appears to qualify for this infusion due to co-morbid conditions and/or a member of an at-risk group in accordance with the FDA Emergency Use Authorization.   Melvin Mitchell was seen at the [facility] on 12/21 with acute onset of congestion and body aches. COVID test was positive. Qualifying risk factors include Celiac disease and asthma.  I contact Melvin Mitchell who expressed that he was not interested in receiving treatment at this time. Advised to contact hotline if he changes his mind.   Terri Piedra, NP 04/16/2020 9:57 AM

## 2020-05-17 ENCOUNTER — Encounter (HOSPITAL_COMMUNITY): Payer: Self-pay | Admitting: Emergency Medicine

## 2020-05-17 ENCOUNTER — Emergency Department (HOSPITAL_COMMUNITY): Payer: 59

## 2020-05-17 ENCOUNTER — Emergency Department (HOSPITAL_COMMUNITY)
Admission: EM | Admit: 2020-05-17 | Discharge: 2020-05-17 | Disposition: A | Discharge status: Home / Self Care | Payer: 59 | Attending: Emergency Medicine | Admitting: Emergency Medicine

## 2020-05-17 ENCOUNTER — Encounter: Payer: Self-pay | Admitting: Emergency Medicine

## 2020-05-17 ENCOUNTER — Other Ambulatory Visit: Payer: Self-pay

## 2020-05-17 ENCOUNTER — Ambulatory Visit
Admission: EM | Admit: 2020-05-17 | Discharge: 2020-05-17 | Disposition: A | Discharge status: Other Acute Inpatient Hospital | Payer: 59

## 2020-05-17 DIAGNOSIS — Y990 Civilian activity done for income or pay: Secondary | ICD-10-CM | POA: Insufficient documentation

## 2020-05-17 DIAGNOSIS — Z7952 Long term (current) use of systemic steroids: Secondary | ICD-10-CM | POA: Diagnosis not present

## 2020-05-17 DIAGNOSIS — J45909 Unspecified asthma, uncomplicated: Secondary | ICD-10-CM | POA: Insufficient documentation

## 2020-05-17 DIAGNOSIS — S61012A Laceration without foreign body of left thumb without damage to nail, initial encounter: Secondary | ICD-10-CM | POA: Insufficient documentation

## 2020-05-17 DIAGNOSIS — S6992XA Unspecified injury of left wrist, hand and finger(s), initial encounter: Secondary | ICD-10-CM | POA: Diagnosis present

## 2020-05-17 DIAGNOSIS — Y9389 Activity, other specified: Secondary | ICD-10-CM | POA: Diagnosis not present

## 2020-05-17 DIAGNOSIS — W231XXA Caught, crushed, jammed, or pinched between stationary objects, initial encounter: Secondary | ICD-10-CM | POA: Diagnosis not present

## 2020-05-17 DIAGNOSIS — Z87891 Personal history of nicotine dependence: Secondary | ICD-10-CM | POA: Diagnosis not present

## 2020-05-17 DIAGNOSIS — Z23 Encounter for immunization: Secondary | ICD-10-CM | POA: Insufficient documentation

## 2020-05-17 MED ORDER — LIDOCAINE HCL (PF) 1 % IJ SOLN
30.0000 mL | Freq: Once | INTRAMUSCULAR | Status: AC
  Administered 2020-05-17: 30 mL
  Filled 2020-05-17: qty 30

## 2020-05-17 MED ORDER — OXYCODONE HCL 5 MG PO TABS: 5.0000 mg | ORAL_TABLET | Freq: Once | ORAL | Status: DC

## 2020-05-17 MED ORDER — IBUPROFEN 400 MG PO TABS: 600.0000 mg | ORAL_TABLET | Freq: Once | ORAL | Status: DC

## 2020-05-17 MED ORDER — ACETAMINOPHEN 500 MG PO TABS: 1000.0000 mg | ORAL_TABLET | Freq: Once | ORAL | Status: DC

## 2020-05-17 MED ORDER — TETANUS-DIPHTH-ACELL PERTUSSIS 5-2.5-18.5 LF-MCG/0.5 IM SUSY
0.5000 mL | PREFILLED_SYRINGE | Freq: Once | INTRAMUSCULAR | Status: AC
  Administered 2020-05-17: 0.5 mL via INTRAMUSCULAR
  Filled 2020-05-17: qty 0.5

## 2020-05-17 MED ORDER — OXYCODONE-ACETAMINOPHEN 5-325 MG PO TABS
1.0000 | ORAL_TABLET | ORAL | Status: DC | PRN
  Administered 2020-05-17: 1 via ORAL
  Filled 2020-05-17: qty 1

## 2020-05-17 NOTE — ED Notes (Signed)
Per PA pt to go for eval in ED for possible open fracture

## 2020-05-17 NOTE — Discharge Instructions (Addendum)
You were seen in the ER for laceration.    Your laceration was repaired with 4 sutures today. Your tetanus shot was updated. Your sutures need to come out within 7-10 days.   The original dressing should be left in place for 24 hours.  If your laceration is small enough, you can remove original dressing after 24 hours after which laceration can be opened to air. Laceration can then be gently cleaned with mild soap and water after 24 hours of laceration repair to prevent crusting over the suture knots. An antibiotic ointment can be applied to the wound as well, twice daily until suture removal. If your laceration is large or can be contaminated you can cover it with a dressing throughout the day.   You may shower or wash the wound with soap and water. Avoid prolonged soaking of stitches including swimming in chlorinated water, pools, hot tubs. Do not swim or soak in natural bodies of water because of a potential increased risk of infection.   Return for swelling, pain, redness, pus, fevers.

## 2020-05-17 NOTE — ED Triage Notes (Signed)
Patient here after crushing his left thumb with a 3000 pound rock while at work. Hemorrhage controlled, patient states he was able to move the thumb when the injury occurred but is having difficulty now.

## 2020-05-17 NOTE — ED Triage Notes (Signed)
Emergency Medicine Provider Triage Evaluation Note  Melvin Mitchell , a 25 y.o. male  was evaluated in triage.  Pt complains of left thumb pain, he is right hand dominant.  He crushed his thumb at about 10am this morning with a heavy rock while at work.   Review of Systems  Positive: Wound, left thumb pain Negative: No fevers or other injury  Physical Exam  BP (!) 123/24 (BP Location: Right Arm)   Pulse 88   Temp 98.1 F (36.7 C) (Oral)   Resp 18   SpO2 98%  Gen:   Awake, no distress   HEENT:  Atraumatic  Resp:  Normal effort  Cardiac:  Normal rate  Abd:   Nondistended, nontender  MSK:   Difficulty moving left thumb.  Left thumb is swollen.   Neuro:  Speech clear  Skin: Please see clinical picture, obvious wounds on left thumb.             Medical Decision Making  Medically screening exam initiated at 12:50 PM.  Appropriate orders placed.  Maze C. Poucher was informed that the remainder of the evaluation will be completed by another provider, this initial triage assessment does not replace that evaluation, and the importance of remaining in the ED until their evaluation is complete.  Clinical Impression  Multiple wounds on left thumb, crush injury to left thumb, no fracture.   DG Hand Complete Left  Result Date: 05/17/2020 CLINICAL DATA:  Crush injury. EXAM: LEFT HAND - COMPLETE 3+ VIEW COMPARISON:  No prior. FINDINGS: Soft tissue injury left thumb. No radiopaque foreign body. No acute bony or joint abnormality. No evidence of fracture or dislocation. IMPRESSION: Soft tissue injury left thumb. No radiopaque foreign body. No acute bony abnormality. Electronically Signed   By: Marcello Moores  Register   On: 05/17/2020 12:38      Lorin Glass, PA-C 05/17/20 1254

## 2020-05-17 NOTE — ED Provider Notes (Signed)
[facility] EMERGENCY DEPARTMENT Provider Note   CSN: [id number] Arrival date & time: 05/17/20  1149     History Chief Complaint  Patient presents with  . Finger Injury    Melvin Mitchell is a 25 y.o. male presents to the ED for evaluation of left thumb injury that occurred while at work around 10 AM today.  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  He was manipulating a 3000 pound piece of rock and states his left thumb got caught underneath the rock.  He was able to quickly move it out.  There is associated wound that initially bled a lot but has since stopped.  Has local pain that is constant, mild to moderate and more severe with any movement or palpation.  States at first he was able to move his thumb all around without significant pain due to adrenaline.  Reports the pain has since worsened.  Reports sensation at the fingertip is normal.  No other injuries.  He is right-hand dominant.  Unknown tetanus status.  No interventions.  HPI     Past Medical History:  Diagnosis Date  . ADHD (attention deficit hyperactivity disorder)   . Asthma    triggered by sensoanl allergies  . Celiac disease   . Dysphagia    Started with choking incident at school  . Seasonal allergies    Spring    Patient Active Problem List   Diagnosis Date Noted  . Asthma 09/21/2015  . Acute rhinosinusitis 09/20/2015  . Cough 09/20/2015  . Right Monteggia fracture 06/26/2014  . Celiac disease 04/16/2012  . Difficulty swallowing     Past Surgical History:  Procedure Laterality Date  . ESOPHAGOGASTRODUODENOSCOPY  03/27/2012   Procedure: ESOPHAGOGASTRODUODENOSCOPY (EGD);  Surgeon: Oletha Blend, MD;  Location: [facility];  Service: Gastroenterology;  Laterality: N/A;  possible dilatation  . ORIF ELBOW FRACTURE Right 06/27/2014   Procedure: OPEN REDUCTION INTERNAL FIXATION (ORIF) ELBOW/OLECRANON FRACTURE;  Surgeon: Iran Planas, MD;  Location: [facility];  Service: Orthopedics;  Laterality:  Right;       Family History  Problem Relation Age of Onset  . Hyperlipidemia Mother   . Hypertension Mother   . Diabetes Mother   . Hyperlipidemia Maternal Grandfather   . Hypertension Maternal Grandfather   . Stroke Maternal Grandfather   . Heart disease Paternal Grandfather   . Heart attack Paternal Grandfather   . Dementia Maternal Grandmother     Social History   Tobacco Use  . Smoking status: Former Smoker    Packs/day: 0.25    Types: Cigarettes, E-cigarettes    Quit date: 08/30/2015    Years since quitting: 4.7  . Smokeless tobacco: Former Systems developer    Types: Chew    Quit date: 09/20/2014  . Tobacco comment: off and on  Vaping Use  . Vaping Use: Former  . Quit date: 06/09/2019  Substance Use Topics  . Alcohol use: Yes    Comment: once to twice a week  . Drug use: Not Currently    Home Medications Prior to Admission medications   Medication Sig Start Date End Date Taking? Authorizing Provider  albuterol (VENTOLIN HFA) 108 (90 Base) MCG/ACT inhaler Inhale 2 puffs into the lungs every 4 (four) hours as needed for wheezing or shortness of breath. 10/28/19   Hall-Potvin, [place], PA-C  dextromethorphan-guaiFENesin (MUCINEX DM) 30-600 MG 12hr tablet Take 1 tablet by mouth 2 (two) times daily. 04/11/20   Wieters, Hallie C, PA-C  fluticasone (FLONASE) 50  MCG/ACT nasal spray Place 1-2 sprays into both nostrils daily. 04/11/20   Wieters, Hallie C, PA-C  ibuprofen (ADVIL) 600 MG tablet Take 1 tablet (600 mg total) by mouth every 6 (six) hours as needed. 04/11/20   Wieters, Hallie C, PA-C  Melatonin 1 MG TABS Take by mouth. As needed, not sure of the exact mg-dose    [provider]  neomycin-polymyxin-hydrocortisone (CORTISPORIN) OTIC solution Place 3 drops into both ears 3 (three) times daily. Patient not taking: Reported on 05/17/2020 11/24/19   Hall-Potvin, [place], PA-C    Allergies    Patient has no known allergies.  Review of Systems   Review of Systems   Musculoskeletal: Positive for arthralgias.  Skin: Positive for wound.  All other systems reviewed and are negative.   Physical Exam Updated Vital Signs BP 111/66 (BP Location: Left Arm)   Pulse (!) 56   Temp (!) 97.5 F (36.4 C) (Oral)   Resp 16   SpO2 99%   Physical Exam Constitutional:      Appearance: He is well-developed.  HENT:     Head: Normocephalic.     Nose: Nose normal.  Eyes:     General: Lids are normal.  Cardiovascular:     Rate and Rhythm: Normal rate.  Pulmonary:     Effort: Pulmonary effort is normal. No respiratory distress.  Musculoskeletal:        General: Normal range of motion.     Left hand: Laceration and tenderness present.     Cervical back: Normal range of motion.     Comments: Left thumb: lacerations as below. Full active ROM at MCP, IP joint. Full thumb opposition to pinky finger tip with pain. Good strength against resistance of thumb at MCP and IP joints.   Skin:    Findings: Laceration present.     Comments: Irregular laceration approx 5 cm, curved, along radial lateral aspect of left thumb with a skin flap.  Skin avulsion at palmar aspect of thumb at IP joint approx 2 cm. Wounds are hemostatic, tender. Minimal diffuse thumb edema. Cap refill normal at finger tip.   Neurological:     Mental Status: He is alert.     Comments: Sensation at finger tip to light touch/sharp intact   Psychiatric:        Behavior: Behavior normal.     ED Results / Procedures / Treatments   Labs (all labs ordered are listed, but only abnormal results are displayed) Labs Reviewed - No data to display  EKG None  Radiology DG Hand Complete Left  Result Date: 05/17/2020 CLINICAL DATA:  Crush injury. EXAM: LEFT HAND - COMPLETE 3+ VIEW COMPARISON:  No prior. FINDINGS: Soft tissue injury left thumb. No radiopaque foreign body. No acute bony or joint abnormality. No evidence of fracture or dislocation. IMPRESSION: Soft tissue injury left thumb. No radiopaque  foreign body. No acute bony abnormality. Electronically Signed   By: Marcello Moores  Register   On: 05/17/2020 12:38    Procedures .Marland KitchenLaceration Repair  Date/Time: 05/17/2020 4:25 PM Performed by: Kinnie Feil, PA-C Authorized by: Kinnie Feil, PA-C   Consent:    Consent obtained:  Verbal   Consent given by:  Patient   Risks discussed:  Infection, need for additional repair, pain, poor cosmetic result and poor wound healing   Alternatives discussed:  No treatment and delayed treatment Universal protocol:    Procedure explained and questions answered to patient or proxy's satisfaction: yes     Relevant documents  present and verified: yes     Test results available: yes     Imaging studies available: yes     Required blood products, implants, devices, and special equipment available: yes     Site/side marked: yes     Immediately prior to procedure, a time out was called: yes     Patient identity confirmed:  Verbally with patient Anesthesia:    Anesthesia method:  Local infiltration   Local anesthetic:  Lidocaine 1% w/o epi Laceration details:    Location:  Finger   Finger location:  L thumb   Length (cm):  5 Pre-procedure details:    Preparation:  Patient was prepped and draped in usual sterile fashion and imaging obtained to evaluate for foreign bodies Exploration:    Limited defect created (wound extended): yes     Hemostasis achieved with:  Direct pressure   Imaging obtained: x-ray     Imaging outcome: foreign body not noted     Wound exploration: wound explored through full range of motion and entire depth of wound visualized     Wound extent: no foreign bodies/material noted, no muscle damage noted, no nerve damage noted, no tendon damage noted, no underlying fracture noted and no vascular damage noted     Contaminated: yes   Treatment:    Area cleansed with:  Saline   Amount of cleaning:  Extensive   Irrigation solution:  Sterile saline   Irrigation volume:  1L    Irrigation method:  Tap   Visualized foreign bodies/material removed: no     Undermining:  Minimal Skin repair:    Repair method:  Sutures   Suture size:  4-0   Suture material:  Prolene   Suture technique:  Simple interrupted   Number of sutures:  4 Approximation:    Approximation:  Close Repair type:    Repair type:  Intermediate Post-procedure details:    Dressing:  Antibiotic ointment, non-adherent dressing, splint for protection and tube gauze   Procedure completion:  Tolerated well, no immediate complications     Medications Ordered in ED Medications  oxyCODONE-acetaminophen (PERCOCET/ROXICET) 5-325 MG per tablet 1 tablet (1 tablet Oral Given 05/17/20 1216)  oxyCODONE (Oxy IR/ROXICODONE) immediate release tablet 5 mg (has no administration in time range)  acetaminophen (TYLENOL) tablet 1,000 mg (has no administration in time range)  ibuprofen (ADVIL) tablet 600 mg (has no administration in time range)  lidocaine (PF) (XYLOCAINE) 1 % injection 30 mL (30 mLs Infiltration Given by Other 05/17/20 1600)  Tdap (BOOSTRIX) injection 0.5 mL (0.5 mLs Intramuscular Given 05/17/20 1536)    ED Course  I have reviewed the triage vital signs and the nursing notes.  Pertinent labs & imaging results that were available during my care of the patient were reviewed by me and considered in my medical decision making (see chart for details).    MDM Rules/Calculators/A&P                          25 year old male presents for left thumb traumatic injury while at work.  He has a 5 cm irregular laceration of the left thumb.  No involvement of the nail.  Based on my exam no findings to suggest significant vascular, nerve or ligament/tendon injury.  EMR, triage and nursing notes reviewed  Imaging ordered: Hand x-ray  Labs ordered: N/A  Medicines ordered: Oxycodone, ibuprofen, Tylenol, Tdap  Personally visualized and interpreted above labs and imaging   Imaging reveals -soft  tissue injury but  no acute bony injury  Labs reveal -N/A  Wound thoroughly irrigated and repaired by me without immediate complications  Will discharge with wound care instructions, protective splint, OTC NSAIDs for pain. Suture removal in 7-10 days. Return precautions discussed.  Patient in agreement with ER treatment and discharge plan.   Final Clinical Impression(s) / ED Diagnoses Final diagnoses:  Laceration of left thumb without foreign body without damage to nail, initial encounter  Work related injury    Rx / DC Orders ED Discharge Orders    None       Arlean Hopping 05/17/20 1631    Margette Fast, MD 05/19/20 1154

## 2020-05-17 NOTE — ED Notes (Signed)
Discharge instructions discussed with pt. Pt verbalized understanding. Pt stable and ambulatory. No signature pad available. 

## 2020-05-17 NOTE — ED Triage Notes (Signed)
Pt with left thumb laceration from crush injury with large rock; pt sts he "pulled his thumb out"; pt with laceration noted bleeding controlled with dressing

## 2020-05-24 ENCOUNTER — Other Ambulatory Visit: Payer: Self-pay

## 2020-05-24 ENCOUNTER — Ambulatory Visit
Admission: EM | Admit: 2020-05-24 | Discharge: 2020-05-24 | Disposition: A | Discharge status: Home / Self Care | Payer: 59 | Attending: Urgent Care | Admitting: Urgent Care

## 2020-05-24 DIAGNOSIS — Z5189 Encounter for other specified aftercare: Secondary | ICD-10-CM

## 2020-05-24 DIAGNOSIS — M79645 Pain in left finger(s): Secondary | ICD-10-CM

## 2020-05-24 DIAGNOSIS — S61012A Laceration without foreign body of left thumb without damage to nail, initial encounter: Secondary | ICD-10-CM

## 2020-05-24 MED ORDER — NAPROXEN 500 MG PO TABS: 500.0000 mg | ORAL_TABLET | Freq: Two times a day (BID) | ORAL | 0 refills | Status: DC

## 2020-05-24 NOTE — ED Provider Notes (Signed)
[facility]   MRN: [id number] DOB: 1996-03-23  Subjective:   Melvin Mitchell is a 25 y.o. male presenting for left thumb wound check.  Patient suffered an injury, laceration to his left thumb.  Was seen at the emergency room, refer to that note for further details on his injury.  Had a negative x-ray.  Sutures were placed.  No antibiotics were used and wants to make sure that he does not have infection.  Patient states that he has been using peroxide and keeping his hand wrapped.  Has used Tylenol intermittently.  No fever, redness, drainage of pus, bleeding, red streaks along his hand, forearm, nausea, vomiting.  No current facility-administered medications for this encounter.  Current Outpatient Medications:  .  dextromethorphan-guaiFENesin (MUCINEX DM) 30-600 MG 12hr tablet, Take 1 tablet by mouth 2 (two) times daily., Disp: 20 tablet, Rfl: 0 .  fluticasone (FLONASE) 50 MCG/ACT nasal spray, Place 1-2 sprays into both nostrils daily., Disp: 16 g, Rfl: 0 .  ibuprofen (ADVIL) 600 MG tablet, Take 1 tablet (600 mg total) by mouth every 6 (six) hours as needed., Disp: 30 tablet, Rfl: 0 .  Melatonin 1 MG TABS, Take by mouth. As needed, not sure of the exact mg-dose, Disp: , Rfl:  .  naproxen (NAPROSYN) 500 MG tablet, Take 1 tablet (500 mg total) by mouth 2 (two) times daily with a meal., Disp: 30 tablet, Rfl: 0 .  albuterol (VENTOLIN HFA) 108 (90 Base) MCG/ACT inhaler, Inhale 2 puffs into the lungs every 4 (four) hours as needed for wheezing or shortness of breath., Disp: 18 g, Rfl: 0 .  neomycin-polymyxin-hydrocortisone (CORTISPORIN) OTIC solution, Place 3 drops into both ears 3 (three) times daily. (Patient not taking: No sig reported), Disp: 10 mL, Rfl: 0   No Known Allergies  Past Medical History:  Diagnosis Date  . ADHD (attention deficit hyperactivity disorder)   . Asthma    triggered by sensoanl allergies  . Celiac disease   . Dysphagia    Started with choking  incident at school  . Seasonal allergies    Spring     Past Surgical History:  Procedure Laterality Date  . ESOPHAGOGASTRODUODENOSCOPY  03/27/2012   Procedure: ESOPHAGOGASTRODUODENOSCOPY (EGD);  Surgeon: Oletha Blend, MD;  Location: [facility];  Service: Gastroenterology;  Laterality: N/A;  possible dilatation  . ORIF ELBOW FRACTURE Right 06/27/2014   Procedure: OPEN REDUCTION INTERNAL FIXATION (ORIF) ELBOW/OLECRANON FRACTURE;  Surgeon: Iran Planas, MD;  Location: [facility];  Service: Orthopedics;  Laterality: Right;    Family History  Problem Relation Age of Onset  . Hyperlipidemia Mother   . Hypertension Mother   . Diabetes Mother   . Hyperlipidemia Maternal Grandfather   . Hypertension Maternal Grandfather   . Stroke Maternal Grandfather   . Heart disease Paternal Grandfather   . Heart attack Paternal Grandfather   . Dementia Maternal Grandmother     Social History   Tobacco Use  . Smoking status: Former Smoker    Packs/day: 0.25    Types: Cigarettes, E-cigarettes    Quit date: 08/30/2015    Years since quitting: 4.7  . Smokeless tobacco: Former Systems developer    Types: Chew    Quit date: 09/20/2014  . Tobacco comment: off and on  Vaping Use  . Vaping Use: Former  . Quit date: 06/09/2019  Substance Use Topics  . Alcohol use: Yes    Comment: once to twice a week  . Drug use: Not Currently    ROS  Objective:   Vitals: BP 111/64 (BP Location: Right Arm)   Pulse 63   Temp 98.1 F (36.7 C) (Oral)   Resp 20   SpO2 98%   Physical Exam Constitutional:      General: He is not in acute distress.    Appearance: Normal appearance. He is well-developed and normal weight. He is not ill-appearing, toxic-appearing or diaphoretic.  HENT:     Head: Normocephalic and atraumatic.     Right Ear: External ear normal.     Left Ear: External ear normal.     Nose: Nose normal.     Mouth/Throat:     Pharynx: Oropharynx is clear.  Eyes:     General: No scleral icterus.       Right eye: No  discharge.        Left eye: No discharge.     Extraocular Movements: Extraocular movements intact.     Pupils: Pupils are equal, round, and reactive to light.  Cardiovascular:     Rate and Rhythm: Normal rate.  Pulmonary:     Effort: Pulmonary effort is normal.  Musculoskeletal:       Hands:     Cervical back: Normal range of motion.  Neurological:     Mental Status: He is alert and oriented to person, place, and time.  Psychiatric:        Mood and Affect: Mood normal.        Behavior: Behavior normal.        Thought Content: Thought content normal.        Judgment: Judgment normal.         Assessment and Plan :   PDMP not reviewed this encounter.  1. Pain of left thumb   2. Visit for wound check   3. Laceration of left thumb without foreign body without damage to nail, initial encounter     Wound is very reassuring, resolving.  Counseled on general wound care, use naproxen for pain and inflammation.  Recommended he keep the wound clean and dry, no use of ointments or creams or peroxide.  Follow-up in 7 to 10 days for suture removal. Counseled patient on potential for adverse effects with medications prescribed/recommended today, ER and return-to-clinic precautions discussed, patient verbalized understanding.    Jaynee Eagles, PA-C 05/24/20 1043

## 2020-05-24 NOTE — ED Triage Notes (Signed)
Pt crushed thumb with rock while at work and was seen and had sutures done a week ago. Pt is now concerned it may be infected due to drainage. Pt states he received no antibiotics at the ED. Pt isd aox4 and ambulatory.

## 2020-05-24 NOTE — Discharge Instructions (Signed)

## 2020-11-13 ENCOUNTER — Telehealth: Payer: 59 | Admitting: Physician Assistant

## 2020-11-13 ENCOUNTER — Encounter: Payer: Self-pay | Admitting: Physician Assistant

## 2020-11-13 DIAGNOSIS — Z202 Contact with and (suspected) exposure to infections with a predominantly sexual mode of transmission: Secondary | ICD-10-CM

## 2020-11-13 MED ORDER — AZITHROMYCIN 500 MG PO TABS: 1000.0000 mg | ORAL_TABLET | Freq: Once | ORAL | 0 refills | Status: AC

## 2020-11-13 NOTE — Progress Notes (Signed)
Virtual Visit Consent   Melvin Mitchell, you are scheduled for a virtual visit with a [facility] provider today.     Just as with appointments in the office, your consent must be obtained to participate.  Your consent will be active for this visit and any virtual visit you may have with one of our providers in the next 365 days.     If you have a MyChart account, a copy of this consent can be sent to you electronically.  All virtual visits are billed to your insurance company just like a traditional visit in the office.    As this is a virtual visit, video technology does not allow for your provider to perform a traditional examination.  This may limit your provider's ability to fully assess your condition.  If your provider identifies any concerns that need to be evaluated in person or the need to arrange testing (such as labs, EKG, etc.), we will make arrangements to do so.     Although advances in technology are sophisticated, we cannot ensure that it will always work on either your end or our end.  If the connection with a video visit is poor, the visit may have to be switched to a telephone visit.  With either a video or telephone visit, we are not always able to ensure that we have a secure connection.     I need to obtain your verbal consent now.   Are you willing to proceed with your visit today?    Reinhart C. Willert has provided verbal consent on 11/13/2020 for a virtual visit (video or telephone).   Mar Daring, PA-C   Date: 11/13/2020 1:02 PM   Virtual Visit via Video Note   I, Mar Daring, connected with  Melvin Mitchell  ([id number], 08/20/1995) on 11/13/20 at 12:45 PM EDT by a video-enabled telemedicine application and verified that I am speaking with the correct person using two identifiers.  Location: Patient: Virtual Visit Location Patient: Home Provider: Virtual Visit Location Provider: Home Office   I discussed the limitations of evaluation and  management by telemedicine and the availability of in person appointments. The patient expressed understanding and agreed to proceed.    History of Present Illness: Melvin Mitchell is a 25 y.o. who identifies as a male who was assigned male at birth, and is being seen today for possible STD, chlamydia. Has been notified that they may have been exposed to chlamydia.He denies any symptoms at this time. He reports he has not had sexual intercourse with any other partners.   Problems:  Patient Active Problem List   Diagnosis Date Noted   Asthma 09/21/2015   Acute rhinosinusitis 09/20/2015   Cough 09/20/2015   Right Monteggia fracture 06/26/2014   Celiac disease 04/16/2012   Difficulty swallowing     Allergies: No Known Allergies Medications:  Current Outpatient Medications:    azithromycin (ZITHROMAX) 500 MG tablet, Take 2 tablets (1,000 mg total) by mouth once for 1 dose., Disp: 2 tablet, Rfl: 0   albuterol (VENTOLIN HFA) 108 (90 Base) MCG/ACT inhaler, Inhale 2 puffs into the lungs every 4 (four) hours as needed for wheezing or shortness of breath., Disp: 18 g, Rfl: 0   dextromethorphan-guaiFENesin (MUCINEX DM) 30-600 MG 12hr tablet, Take 1 tablet by mouth 2 (two) times daily., Disp: 20 tablet, Rfl: 0   fluticasone (FLONASE) 50 MCG/ACT nasal spray, Place 1-2 sprays into both nostrils daily., Disp: 16 g, Rfl: 0  ibuprofen (ADVIL) 600 MG tablet, Take 1 tablet (600 mg total) by mouth every 6 (six) hours as needed., Disp: 30 tablet, Rfl: 0   Melatonin 1 MG TABS, Take by mouth. As needed, not sure of the exact mg-dose, Disp: , Rfl:    naproxen (NAPROSYN) 500 MG tablet, Take 1 tablet (500 mg total) by mouth 2 (two) times daily with a meal., Disp: 30 tablet, Rfl: 0   neomycin-polymyxin-hydrocortisone (CORTISPORIN) OTIC solution, Place 3 drops into both ears 3 (three) times daily. (Patient not taking: No sig reported), Disp: 10 mL, Rfl: 0  Observations/Objective: Patient is well-developed,  well-nourished in no acute distress.  Resting comfortably at home.  Head is normocephalic, atraumatic.  No labored breathing.  Speech is clear and coherent with logical content.  Patient is alert and oriented at baseline.    Assessment and Plan: 1. Chlamydia contact - azithromycin (ZITHROMAX) 500 MG tablet; Take 2 tablets (1,000 mg total) by mouth once for 1 dose.  Dispense: 2 tablet; Refill: 0 - Will treat empirically as above with azithromycin 1g x 1 - Advised he should still follow up with UC or local health department for further testing to rule out any other STIs; he voiced understanding and agrees - Also discussed if he desires test of cure, he should be tested in 2-3 weeks following treatment - Practice safe sex  Follow Up Instructions: I discussed the assessment and treatment plan with the patient. The patient was provided an opportunity to ask questions and all were answered. The patient agreed with the plan and demonstrated an understanding of the instructions.  A copy of instructions were sent to the patient via MyChart.  The patient was advised to call back or seek an in-person evaluation if the symptoms worsen or if the condition fails to improve as anticipated.  Time:  I spent 12 minutes with the patient via telehealth technology discussing the above problems/concerns.    Mar Daring, PA-C

## 2020-11-13 NOTE — Patient Instructions (Addendum)
Hoke C. Maricela Bo, thank you for joining Mar Daring, PA-C for today's virtual visit.  While this provider is not your primary care provider (PCP), if your PCP is located in our provider database this encounter information will be shared with them immediately following your visit.  Consent: (Patient) Melvin Mitchell provided verbal consent for this virtual visit at the beginning of the encounter.  Current Medications:  Current Outpatient Medications:    azithromycin (ZITHROMAX) 500 MG tablet, Take 2 tablets (1,000 mg total) by mouth once for 1 dose., Disp: 2 tablet, Rfl: 0   albuterol (VENTOLIN HFA) 108 (90 Base) MCG/ACT inhaler, Inhale 2 puffs into the lungs every 4 (four) hours as needed for wheezing or shortness of breath., Disp: 18 g, Rfl: 0   dextromethorphan-guaiFENesin (MUCINEX DM) 30-600 MG 12hr tablet, Take 1 tablet by mouth 2 (two) times daily., Disp: 20 tablet, Rfl: 0   fluticasone (FLONASE) 50 MCG/ACT nasal spray, Place 1-2 sprays into both nostrils daily., Disp: 16 g, Rfl: 0   ibuprofen (ADVIL) 600 MG tablet, Take 1 tablet (600 mg total) by mouth every 6 (six) hours as needed., Disp: 30 tablet, Rfl: 0   Melatonin 1 MG TABS, Take by mouth. As needed, not sure of the exact mg-dose, Disp: , Rfl:    naproxen (NAPROSYN) 500 MG tablet, Take 1 tablet (500 mg total) by mouth 2 (two) times daily with a meal., Disp: 30 tablet, Rfl: 0   neomycin-polymyxin-hydrocortisone (CORTISPORIN) OTIC solution, Place 3 drops into both ears 3 (three) times daily. (Patient not taking: No sig reported), Disp: 10 mL, Rfl: 0   Medications ordered in this encounter:  Meds ordered this encounter  Medications   azithromycin (ZITHROMAX) 500 MG tablet    Sig: Take 2 tablets (1,000 mg total) by mouth once for 1 dose.    Dispense:  2 tablet    Refill:  0    Order Specific Question:   Supervising Provider    Answer:   Sabra Heck, BRIAN [[id number]]     *If you need refills on other medications prior to your  next appointment, please contact your pharmacy*  Follow-Up: Call back or seek an in-person evaluation if the symptoms worsen or if the condition fails to improve as anticipated.  Other Instructions See below   If you have been instructed to have an in-person evaluation today at a local Urgent Care facility, please use the link below. It will take you to a list of all of our available [facility], including address, phone number and hours of operation. Please do not delay care.  [facility]  If you or a family member do not have a primary care provider, use the link below to schedule a visit and establish care. When you choose a [facility] primary care physician or advanced practice provider, you gain a long-term partner in health. Find a Primary Care Provider  Learn more about 's in-office and virtual care options: [facility]   Chlamydia, Male Chlamydia is an STI (sexually transmitted infection) that is caused by bacteria. This infection spreads through sexual contact. Chlamydia can occur in different areas of the body, including: The urethra. This is the part of the body that drains urine from the bladder and through the penis. The throat. The rectum. This condition is not difficult to treat. However, if left untreated, chlamydiacan lead to more serious health problems. What are the causes? This condition is caused by the  bacteria Chlamydia trachomatis. The bacteria are spread from an infected partner during sexual activity.Chlamydia can spread through contact with the genitals, mouth, or rectum. What increases the risk? The following factors may make you more likely to develop this condition: Not using a condom correctly or not using a condom every time you have sex. Having a new sex partner or having more than one sex partner. Being a man who has sex with men (MSM). What are the signs or symptoms? In some cases, there are no  symptoms, especially early in the infection. Havingno symptoms is also called being asymptomatic. If symptoms develop, they may include: Urinating often, or having a burning feeling during urination. Pain or swelling in the testicles. Watery, mucus-like discharge from the penis. Redness, soreness, swelling, bleeding, or discharge from the rectum. This may occur if the infection was spread through anal sex. Pain in the abdomen. Itching, burning, or redness in the eyes, or discharge from the eyes. Pain during sex. How is this diagnosed? This condition may be diagnosed with: Urine tests. Swab tests. Depending on your symptoms, your health care provider may use a cotton swab to collect discharge from your urethra or rectum to test for the bacteria. How is this treated? This condition is treated with antibiotic medicines. Follow these instructions at home: Sexual activity Tell your sex partner or partners about your infection. These include any partners for oral, anal, or vaginal sex that you have had within 60 days of when your symptoms started. Sex partners should also be treated, even if they have no signs of the disease. Do not have sex until you and your sex partners have completed treatment and your health care provider says it is okay. If your health care provider prescribed you a single-dose medicine as treatment, wait at least 7 days after taking the medicine before having sex. General instructions Take over-the-counter and prescription medicines as told by your health care provider. Finish all antibiotic medicine even when you start to feel better. It is up to you to get your test results. Ask your health care provider, or the department that is doing the test, when your results will be ready. Get plenty of rest. Eat a healthy, well-balanced diet. Drink enough fluids to keep your urine pale yellow. Keep all follow-up visits as told by your health care provider. This is important. You  may need to be tested for infection again 3 months after treatment. How is this prevented? The only sure way to prevent chlamydia is to avoid having vaginal, anal, and oral sex. However, you can lower your risk by: Using latex condoms correctly every time you have sex. Not having multiple sexual partners. Asking if your sex partner has been tested for STIs and had negative results. Getting regular health screenings to check for STIs. Contact a health care provider if: You develop new symptoms or your symptoms do not get better after you complete treatment. You have a fever or chills. You have pain during sex. You develop new joint pain or swelling near your joints. You have pain or soreness in your testicles. Get help right away if: Your pain gets worse and does not get better with medicine. You have abnormal discharge. You develop flu-like symptoms, such as night sweats, sore throat, or muscle aches. Summary Chlamydia is an STI (sexually transmitted infection) that is caused by bacteria. This infection spreads through sexual contact. This condition is not difficult to treat. However, if left untreated, it can lead to more serious  health problems. Some people have no symptoms (are asymptomatic), especially early in the infection. This condition is treated with antibiotic medicines. Using latex condoms correctly every time you have sex can help prevent chlamydia. This information is not intended to replace advice given to you by your health care provider. Make sure you discuss any questions you have with your healthcare provider. Document Revised: 04/26/2019 Document Reviewed: 04/26/2019 Elsevier Patient Education  [place] Sex Practicing safe sex means taking steps before and during sex to reduce your risk of: Getting an STI (sexually transmitted infection). Giving your partner an STI. Unwanted or unplanned pregnancy. How to practice safe sex Ways you can practice  safe sex  Limit your sexual partners to only one partner who is having sex with only you. Avoid using alcohol and drugs before having sex. Alcohol and drugs can affect your judgment. Before having sex with a new partner: Talk to your partner about past partners, past STIs, and drug use. Get screened for STIs and discuss the results with your partner. Ask your partner to get screened too. Check your body regularly for sores, blisters, rashes, or unusual discharge. If you notice any of these problems, visit your health care provider. Avoid sexual contact if you have symptoms of an infection or you are being treated for an STI. While having sex, use a condom. Make sure to: Use a condom every time you have vaginal, oral, or anal sex. Both females and males should wear condoms during oral sex. Keep condoms in place from the beginning to the end of sexual activity. Use a latex condom, if possible. Latex condoms offer the best protection. Use only water-based lubricants with a condom. Using petroleum-based lubricants or oils will weaken the condom and increase the chance that it will break.  Ways your health care provider can help you practice safe sex  See your health care provider for regular screenings, exams, and tests for STIs. Talk with your health care provider about what kind of birth control (contraception) is best for you. Get vaccinated against hepatitis B and human papillomavirus (HPV). If you are at risk of being infected with HIV (human immunodeficiency virus), talk with your health care provider about taking a prescription medicine to prevent HIV infection. You are at risk for HIV if you: Are a man who has sex with other men. Are sexually active with more than one partner. Take drugs by injection. Have a sex partner who has HIV. Have unprotected sex. Have sex with someone who has sex with both men and women. Have had an STI.  Follow these instructions at home: Take  over-the-counter and prescription medicines only as told by your health care provider. Keep all follow-up visits. This is important. Where to find more information Centers for Disease Control and Prevention: http://www.wolf.info/ Planned Parenthood: www.plannedparenthood.org Office on [employer] Health: [place] Summary Practicing safe sex means taking steps before and during sex to reduce your risk getting an STI, giving your partner an STI, and having an unwanted or unplanned pregnancy. Before having sex with a new partner, talk to your partner about past partners, past STIs, and drug use. Use a condom every time you have vaginal, oral, or anal sex. Both females and males should wear condoms during oral sex. Check your body regularly for sores, blisters, rashes, or unusual discharge. If you notice any of these problems, visit your health care provider. See your health care provider for regular screenings, exams, and tests for STIs. This  information is not intended to replace advice given to you by your health care provider. Make sure you discuss any questions you have with your healthcare provider. Document Revised: 09/13/2019 Document Reviewed: 09/13/2019 Elsevier Patient Education  [place].

## 2020-12-13 ENCOUNTER — Telehealth: Payer: 59 | Admitting: Physician Assistant

## 2020-12-13 ENCOUNTER — Encounter: Payer: Self-pay | Admitting: Physician Assistant

## 2020-12-13 DIAGNOSIS — U071 COVID-19: Secondary | ICD-10-CM

## 2020-12-13 MED ORDER — ALBUTEROL SULFATE HFA 108 (90 BASE) MCG/ACT IN AERS: 2.0000 | INHALATION_SPRAY | Freq: Four times a day (QID) | RESPIRATORY_TRACT | 0 refills | Status: AC | PRN

## 2020-12-13 MED ORDER — BENZONATATE 100 MG PO CAPS: 100.0000 mg | ORAL_CAPSULE | Freq: Three times a day (TID) | ORAL | 0 refills | Status: DC | PRN

## 2020-12-13 NOTE — Progress Notes (Signed)
Virtual Visit Consent   Melvin Mitchell, you are scheduled for a virtual visit with a [facility] provider today.     Just as with appointments in the office, your consent must be obtained to participate.  Your consent will be active for this visit and any virtual visit you may have with one of our providers in the next 365 days.     If you have a MyChart account, a copy of this consent can be sent to you electronically.  All virtual visits are billed to your insurance company just like a traditional visit in the office.    As this is a virtual visit, video technology does not allow for your provider to perform a traditional examination.  This may limit your provider's ability to fully assess your condition.  If your provider identifies any concerns that need to be evaluated in person or the need to arrange testing (such as labs, EKG, etc.), we will make arrangements to do so.     Although advances in technology are sophisticated, we cannot ensure that it will always work on either your end or our end.  If the connection with a video visit is poor, the visit may have to be switched to a telephone visit.  With either a video or telephone visit, we are not always able to ensure that we have a secure connection.     I need to obtain your verbal consent now.   Are you willing to proceed with your visit today?    Melvin Mitchell has provided verbal consent on 12/13/2020 for a virtual visit (video or telephone).   [name], [place]   Date: 12/13/2020 8:09 AM   Virtual Visit via Video Note   I, [name], connected with  Melvin Mitchell  ([id number], 1995-06-04) on 12/13/20 at  8:00 AM EDT by a video-enabled telemedicine application and verified that I am speaking with the correct person using two identifiers.  Location: Patient: Virtual Visit Location Patient: Home Provider: Virtual Visit Location Provider: Home Office   I discussed the limitations of evaluation and  management by telemedicine and the availability of in person appointments. The patient expressed understanding and agreed to proceed.    History of Present Illness: Melvin Mitchell is a 25 y.o. who identifies as a male who was assigned male at birth, and is being seen today for COVID-19. Notes symptoms starting 2 days ago with sore throat, nasal congestion, sinus pressure and fatigue. Notes initially with some mild dizziness that has resolved. Still with some body aches. Denies fever, chills, chest pain or SOB. Some chest tightness. Denies any recent travel or sick contact.    HPI: HPI  Problems:  Patient Active Problem List   Diagnosis Date Noted   Asthma 09/21/2015   Acute rhinosinusitis 09/20/2015   Cough 09/20/2015   Right [place] fracture 06/26/2014   Celiac disease 04/16/2012   Difficulty swallowing     Allergies: No Known Allergies Medications:  Current Outpatient Medications:    albuterol (VENTOLIN HFA) 108 (90 Base) MCG/ACT inhaler, Inhale 2 puffs into the lungs every 6 (six) hours as needed for wheezing or shortness of breath., Disp: 8 g, Rfl: 0   benzonatate (TESSALON) 100 MG capsule, Take 1 capsule (100 mg total) by mouth 3 (three) times daily as needed for cough., Disp: 30 capsule, Rfl: 0  Observations/Objective: Patient is well-developed, well-nourished in no acute distress.  Resting comfortably at home.  Head is normocephalic, atraumatic.  No labored breathing. Speech is clear and coherent with logical content.  Patient is alert and oriented at baseline.   Assessment and Plan: 1. COVID-19 - albuterol (VENTOLIN HFA) 108 (90 Base) MCG/ACT inhaler; Inhale 2 puffs into the lungs every 6 (six) hours as needed for wheezing or shortness of breath.  Dispense: 8 g; Refill: 0 - benzonatate (TESSALON) 100 MG capsule; Take 1 capsule (100 mg total) by mouth 3 (three) times daily as needed for cough.  Dispense: 30 capsule; Refill: 0 - MyChart COVID-19 home monitoring program;  Future Milder symptoms in a younger individual. No indication for antiviral. Supportive measures, OTC medications and vitamin regimen reviewed. Rx Tessalon and albuterol. Patient enrolled in a symptom monitoring program through MyChart. Quarantine reviewed. ER precautions reviewed in detail. He declines work note.  Follow Up Instructions: I discussed the assessment and treatment plan with the patient. The patient was provided an opportunity to ask questions and all were answered. The patient agreed with the plan and demonstrated an understanding of the instructions.  A copy of instructions were sent to the patient via MyChart.  The patient was advised to call back or seek an in-person evaluation if the symptoms worsen or if the condition fails to improve as anticipated.  Time:  I spent 15 minutes with the patient via telehealth technology discussing the above problems/concerns.    [name], PA-C

## 2020-12-13 NOTE — Patient Instructions (Signed)
[name], thank you for joining [name], PA-C for today's virtual visit.  While this provider is not your primary care provider (PCP), if your PCP is located in our provider database this encounter information will be shared with them immediately following your visit.  Consent: (Patient) Melvin Mitchell provided verbal consent for this virtual visit at the beginning of the encounter.  Current Medications:  Current Outpatient Medications:    albuterol (VENTOLIN HFA) 108 (90 Base) MCG/ACT inhaler, Inhale 2 puffs into the lungs every 4 (four) hours as needed for wheezing or shortness of breath., Disp: 18 g, Rfl: 0   dextromethorphan-guaiFENesin (MUCINEX DM) 30-600 MG 12hr tablet, Take 1 tablet by mouth 2 (two) times daily., Disp: 20 tablet, Rfl: 0   fluticasone (FLONASE) 50 MCG/ACT nasal spray, Place 1-2 sprays into both nostrils daily., Disp: 16 g, Rfl: 0   ibuprofen (ADVIL) 600 MG tablet, Take 1 tablet (600 mg total) by mouth every 6 (six) hours as needed., Disp: 30 tablet, Rfl: 0   Melatonin 1 MG TABS, Take by mouth. As needed, not sure of the exact mg-dose, Disp: , Rfl:    naproxen (NAPROSYN) 500 MG tablet, Take 1 tablet (500 mg total) by mouth 2 (two) times daily with a meal., Disp: 30 tablet, Rfl: 0   neomycin-polymyxin-hydrocortisone (CORTISPORIN) OTIC solution, Place 3 drops into both ears 3 (three) times daily. (Patient not taking: No sig reported), Disp: 10 mL, Rfl: 0   Medications ordered in this encounter:  No orders of the defined types were placed in this encounter.    *If you need refills on other medications prior to your next appointment, please contact your pharmacy*  Follow-Up: Call back or seek an in-person evaluation if the symptoms worsen or if the condition fails to improve as anticipated.  Other Instructions Please keep well-hydrated and get plenty of rest. Start a saline nasal rinse to flush out your nasal passages. You can use plain  Mucinex to help thin congestion. If you have a humidifier, running in the bedroom at night. I want you to start OTC vitamin D3 1000 units daily, vitamin C 1000 mg daily, and a zinc supplement. Please take prescribed medications as directed.  You have been enrolled in a MyChart symptom monitoring program. Please answer these questions daily so we can keep track of how you are doing.  You were to quarantine for 5 days from onset of your symptoms.  After day 5, if you have had no fever and you are feeling better, you can end quarantine but need to mask for an additional 5 days. After day 5 if you have a fever or are having significant symptoms, please quarantine for full 10 days.  If you note any worsening of symptoms, any significant shortness of breath or any chest pain, please seek ER evaluation ASAP.  Please do not delay care!    If you have been instructed to have an in-person evaluation today at a local Urgent Care facility, please use the link below. It will take you to a list of all of our available [facility], including address, phone number and hours of operation. Please do not delay care.  [facility]  If you or a family member do not have a primary care provider, use the link below to schedule a visit and establish care. When you choose a  primary care physician or advanced practice provider, you gain a long-term partner in health. Find a  Primary Care Provider  Learn more about [facility] in-office and virtual care options: [facility]

## 2022-11-11 ENCOUNTER — Other Ambulatory Visit: Payer: Self-pay

## 2022-11-11 ENCOUNTER — Emergency Department: Payer: 59

## 2022-11-11 ENCOUNTER — Emergency Department
Admission: EM | Admit: 2022-11-11 | Discharge: 2022-11-11 | Disposition: A | Discharge status: Home / Self Care | Payer: 59 | Attending: Emergency Medicine | Admitting: Emergency Medicine

## 2022-11-11 DIAGNOSIS — J45909 Unspecified asthma, uncomplicated: Secondary | ICD-10-CM | POA: Diagnosis not present

## 2022-11-11 DIAGNOSIS — R55 Syncope and collapse: Secondary | ICD-10-CM | POA: Diagnosis not present

## 2022-11-11 DIAGNOSIS — R42 Dizziness and giddiness: Secondary | ICD-10-CM

## 2022-11-11 DIAGNOSIS — R0789 Other chest pain: Secondary | ICD-10-CM | POA: Diagnosis not present

## 2022-11-11 LAB — URINALYSIS, ROUTINE W REFLEX MICROSCOPIC
Bilirubin Urine: NEGATIVE
Glucose, UA: NEGATIVE mg/dL
Hgb urine dipstick: NEGATIVE
Ketones, ur: NEGATIVE mg/dL
Leukocytes,Ua: NEGATIVE
Nitrite: NEGATIVE
Protein, ur: NEGATIVE mg/dL
Specific Gravity, Urine: 1.019 (ref 1.005–1.030)
pH: 7 (ref 5.0–8.0)

## 2022-11-11 LAB — BASIC METABOLIC PANEL
Anion gap: 5 (ref 5–15)
BUN: 16 mg/dL (ref 6–20)
CO2: 26 mmol/L (ref 22–32)
Calcium: 8.3 mg/dL — ABNORMAL LOW (ref 8.9–10.3)
Chloride: 108 mmol/L (ref 98–111)
Creatinine, Ser: 0.96 mg/dL (ref 0.61–1.24)
GFR, Estimated: >60 mL/min (ref >60)
Glucose, Bld: 112 mg/dL — ABNORMAL HIGH (ref 70–99)
Potassium: 4.6 mmol/L (ref 3.5–5.1)
Sodium: 139 mmol/L (ref 135–145)

## 2022-11-11 LAB — CBC
HCT: 41.3 % (ref 39.0–52.0)
Hemoglobin: 14.7 g/dL (ref 13.0–17.0)
MCH: 31.3 pg (ref 26.0–34.0)
MCHC: 35.6 g/dL (ref 30.0–36.0)
MCV: 88.1 fL (ref 80.0–100.0)
Platelets: 223 10*3/uL (ref 150–400)
RBC: 4.69 MIL/uL (ref 4.22–5.81)
RDW: 12 % (ref 11.5–15.5)
WBC: 8 10*3/uL (ref 4.0–10.5)
nRBC: 0 % (ref 0.0–0.2)

## 2022-11-11 LAB — TSH: TSH: 0.403 u[IU]/mL (ref 0.350–4.500)

## 2022-11-11 LAB — TROPONIN I (HIGH SENSITIVITY): Troponin I (High Sensitivity): <2 ng/L (ref <18)

## 2022-11-11 MED ORDER — SODIUM CHLORIDE 0.9 % IV BOLUS
1000.0000 mL | Freq: Once | INTRAVENOUS | Status: AC
  Administered 2022-11-11: 1000 mL via INTRAVENOUS

## 2022-11-11 NOTE — ED Triage Notes (Signed)
Ems report: pt with near syncope at work. Was lowered to ground by co-workers. Pt with known ear infection and was started on antibiotics yesterday. Pt given , zofran 4mg 

## 2022-11-11 NOTE — ED Triage Notes (Signed)
Pt to ED for dizziness at work today near syncope. Reports dizziness for months. Denies LOC at work. Went to UC yesterday, dx with ear infection.  Endorses intermittent chest pain for one year.

## 2022-11-11 NOTE — Discharge Instructions (Signed)
Your blood work, EKG, urinalysis, and x-ray are all within normal limits.  Your orthostatic vital signs were also normal.  Please follow-up with an outpatient provider.  Please return for any new, worsening, or change in symptoms or other concerns.  It was a pleasure caring for you today.  Please go to the following website to schedule new (and existing) patient appointments:   [email or web address]   The following is a list of primary care offices in the area who are accepting new patients at this time.  Please reach out to one of them directly and let them know you would like to schedule an appointment to follow up on an Emergency Department visit, and/or to establish a new primary care provider (PCP).  There are likely other primary care clinics in the are who are accepting new patients, but this is an excellent place to start:  [facility] Lead physician: Dr Shirlee Latch [address] [place], [place] [address] [phone]  [facility] Lead Physician: Dr Alba Cory [address], [place], [place] [address] [phone]  [facility]  Lead Physician: Dr Olevia Perches [address] [place], [place] [address] [phone]  [facility] Lead Physician: Dr Sofie Hartigan [address] [place], [place] [address] [phone]  [facility] Primary Care & Sports Medicine at [facility] Lead Physician: Dr Bari Edward [address] [place], [place], [place] [address] [phone]

## 2022-11-11 NOTE — ED Notes (Signed)
See triage note  Presents via EMS States he had a near syncopal episode at work this am   States he was seen and dx'd with ear infection yesterday

## 2022-11-11 NOTE — ED Provider Notes (Signed)
[facility] Provider Note    Event Date/Time   First MD Initiated Contact with Patient 11/11/22 1026     (approximate)   History   Dizziness   HPI  Melvin Mitchell is a 27 y.o. male who presents today for evaluation of dizziness and intermittent chest pain.  Patient reports the chest pain has been ongoing for several months and is intermittent.  He describes it as a sharp bolt of electricity to the center of his chest that lasts for 1 second or less.  He reports that he does not notice it when he is exerting himself, he notices it only when he is at rest sometimes.  He does not currently have any chest pain.  He reports that while he was at work today he felt very lightheaded and almost passed out.  He had to sit down and then he felt better.  Reports that he works in [employer].  He reports that he got tunnel vision while working outside, which improved when he sat down.  He denies shortness of breath.  He reports that he was started on antibiotics yesterday for an ear infection, his ear feels better.  He reports that he feels that his dizziness worsens when he moves his head.  He has no dizziness when he stands still.  He also reports that he feels like his hands are shaky "on the inside."  Patient Active Problem List   Diagnosis Date Noted   Asthma 09/21/2015   Acute rhinosinusitis 09/20/2015   Cough 09/20/2015   Right [place]ggia fracture 06/26/2014   Celiac disease 04/16/2012   Difficulty swallowing              Physical Exam   Triage Vital Signs: ED Triage Vitals  Encounter Vitals Group     BP 11/11/22 0937 123/73     Systolic BP Percentile --      Diastolic BP Percentile --      Pulse Rate 11/11/22 0937 66     Resp 11/11/22 0937 18     Temp 11/11/22 0937 98.3 F (36.8 C)     Temp src --      SpO2 11/11/22 0937 100 %     Weight 11/11/22 0938 130 lb (59 kg)     Height 11/11/22 0938 5\' 11"  (1.803 m)     Head Circumference --      Peak  Flow --      Pain Score 11/11/22 0937 5     Pain Loc --      Pain Education --      Exclude from Growth Chart --     Most recent vital signs: Vitals:   11/11/22 0937 11/11/22 1310  BP: 123/73 120/70  Pulse: 66 68  Resp: 18 18  Temp: 98.3 F (36.8 C) 98 F (36.7 C)  SpO2: 100% 100%    Physical Exam Vitals and nursing note reviewed.  Constitutional:      General: Awake and alert. No acute distress.    Appearance: Normal appearance. The patient is normal weight.  HENT:     Head: Normocephalic and atraumatic.     Mouth: Mucous membranes are moist.  Right ear with posterior tympanic membrane effusion, nonpurulent.  No mastoid tenderness or erythema.  No proptosis of pinna.  Normal left ear. Eyes:     General: PERRL. Normal EOMs        Right eye: No discharge.        Left eye:  No discharge.     Conjunctiva/sclera: Conjunctivae normal.  Cardiovascular:     Rate and Rhythm: Normal rate and regular rhythm.     Pulses: Normal pulses.     Heart sounds: Normal heart sounds Pulmonary:     Effort: Pulmonary effort is normal. No respiratory distress.     Breath sounds: Normal breath sounds.  Abdominal:     Abdomen is soft. There is no abdominal tenderness. No rebound or guarding. No distention. Musculoskeletal:        General: No swelling. Normal range of motion.     Cervical back: Normal range of motion and neck supple.  No lower extremity swelling. Skin:    General: Skin is warm and dry.     Capillary Refill: Capillary refill takes less than 2 seconds.     Findings: No rash.  Neurological:     Mental Status: The patient is awake and alert.   Neurological: GCS 15 alert and oriented x3 Normal speech, no expressive or receptive aphasia or dysarthria Cranial nerves II through XII intact Normal visual fields 5 out of 5 strength in all 4 extremities with intact sensation throughout No extremity drift Normal finger-to-nose testing, no limb or truncal ataxia    ED Results /  Procedures / Treatments   Labs (all labs ordered are listed, but only abnormal results are displayed) Labs Reviewed  BASIC METABOLIC PANEL - Abnormal; Notable for the following components:      Result Value   Glucose, Bld 112 (*)    Calcium 8.3 (*)    All other components within normal limits  URINALYSIS, ROUTINE W REFLEX MICROSCOPIC - Abnormal; Notable for the following components:   Color, Urine YELLOW (*)    APPearance CLEAR (*)    All other components within normal limits  CBC  TSH  TROPONIN I (HIGH SENSITIVITY)  TROPONIN I (HIGH SENSITIVITY)     EKG     RADIOLOGY I independently reviewed and interpreted imaging and agree with radiologists findings.     PROCEDURES:  Critical Care performed:   Procedures   MEDICATIONS ORDERED IN ED: Medications  sodium chloride 0.9 % bolus 1,000 mL (0 mLs Intravenous Stopped 11/11/22 1307)     IMPRESSION / MDM / ASSESSMENT AND PLAN / ED COURSE  I reviewed the triage vital signs and the nursing notes.   Differential diagnosis includes, but is not limited to, dehydration, arrhythmia, electrolyte disarray, vestibular neuronitis, labyrinthitis, vertigo, less likely cardiac etiology or thyroid etiology.  Patient is awake and alert, hemodynamically stable and afebrile.  He has no focal neurological deficits.  He is nontoxic in appearance.  He was placed on the cardiac monitor.  No arrhythmias or ischemic changes noted on EKG.  Chest x-ray demonstrates no acute cardiopulmonary abnormalities.  Labs are overall reassuring including negative troponin, negative TSH.  He does not appear to be acutely dehydrated, no electrolyte disarray or leukocytosis.  He does not have any symptomatic anemia.  He felt lightheaded with orthostatic vital signs, though orthostatics were within normal limits.  Urinalysis reveals no evidence of infection.  Given his recent ear infection, it is quite possible that he has a component of vestibular neuronitis,  though I do not suspect central vertigo given his normal neurological exam.  He has no nystagmus on exam.  His dizziness is intermittent and worsened with movement of the head, not consistent with central etiology.  No headache.  He has no change in his hearing to suggest lymphangitis.  No tenderness, neck  pain or stiffness, recent manipulations of the neck, or cranial nerve deficit to suggest vertebral or carotid artery dissection.  He was treated with IV fluids with significant improvement of his symptoms.  He reports that he feels back to baseline upon reevaluation.  He was offered a work note but he declined.  We discussed the importance of close outpatient follow-up and strict return precautions.  Patient understands and agrees with plan.  He was discharged in stable condition.   Patient's presentation is most consistent with acute complicated illness / injury requiring diagnostic workup.     FINAL CLINICAL IMPRESSION(S) / ED DIAGNOSES   Final diagnoses:  Lightheadedness  Near syncope     Rx / DC Orders   ED Discharge Orders     None        Note:  This document was prepared using Dragon voice recognition software and may include unintentional dictation errors.   Keturah Shavers 11/11/22 1504    Pilar Jarvis, MD 11/11/22 [phone]

## 2022-12-27 ENCOUNTER — Ambulatory Visit: Payer: 59 | Admitting: Nurse Practitioner

## 2022-12-27 ENCOUNTER — Encounter: Payer: Self-pay | Admitting: Nurse Practitioner

## 2022-12-27 VITALS — BP 112/60 | HR 63 | Temp 98.4°F | Ht 69.0 in | Wt 130.4 lb

## 2022-12-27 DIAGNOSIS — R1013 Epigastric pain: Secondary | ICD-10-CM | POA: Insufficient documentation

## 2022-12-27 DIAGNOSIS — Z0000 Encounter for general adult medical examination without abnormal findings: Secondary | ICD-10-CM | POA: Diagnosis not present

## 2022-12-27 DIAGNOSIS — K900 Celiac disease: Secondary | ICD-10-CM

## 2022-12-27 DIAGNOSIS — J4520 Mild intermittent asthma, uncomplicated: Secondary | ICD-10-CM

## 2022-12-27 MED ORDER — OMEPRAZOLE 20 MG PO CPDR: 20.0000 mg | DELAYED_RELEASE_CAPSULE | Freq: Every day | ORAL | 0 refills | Status: AC

## 2022-12-27 NOTE — Assessment & Plan Note (Signed)
Diagnosis through endoscopy.  Patient avoids gluten

## 2022-12-27 NOTE — Patient Instructions (Signed)
Nice to see you today I want to see you in 1 year, sooner if you need me Let me know if the stomach medication does not help

## 2022-12-27 NOTE — Progress Notes (Signed)
New Patient Office Visit  Subjective    Patient ID: Melvin Mitchell, male    DOB: 07/27/1995  Age: 27 y.o. MRN: [id number]  CC:  Chief Complaint  Patient presents with   Establish Care    Pt yes to HPV vaccine    HPI Melvin Mitchell presents to establish care  Asthma: state that as a child. States that he does not have  ADHD: states as a kid they put the medication on hold because of how it made him feel. States that he will start tasks and not finish them at work   Relapsing polychondritis: currently being worked up and being followed by [facility]  for complete physical and follow up of chronic conditions.  Immunizations: -Tetanus: Completed in 2022 -Influenza: defer -Shingles: Completed Shingrix series -Pneumonia: Completed  -HPV:  Diet: Fair diet. States that he does 3 meals a a day. States that breakfast is hard for him. States that he does feel nauseous in the morning. Everyday states that he will eat before bed states that it will go away after .  States that he will drink soda, gatorade, and water  Exercise: No regular exercise. employment  Eye exam: Completes annually. Glasses Dental exam: Completes semi-annually    Colonoscopy: Too young, currently average risk Lung Cancer Screening: N/A  PSA: Too young, currently average risk  Sleep: states that he goes to bed 1030-11 and get up at 645am. Does not feel snore. Does not snore     Outpatient Encounter Medications as of 12/27/2022  Medication Sig   benzonatate (TESSALON) 100 MG capsule Take 1 capsule (100 mg total) by mouth 3 (three) times daily as needed for cough.   naproxen (NAPROSYN) 500 MG tablet 1 tablet with food or milk Orally every 12 hrs for 30 days   omeprazole (PRILOSEC) 20 MG capsule Take 1 capsule (20 mg total) by mouth daily.   predniSONE (DELTASONE) 20 MG tablet 1 tablet Orally Once a day for 5 days   [DISCONTINUED] omeprazole (PRILOSEC) 20 MG capsule Take 20 mg by mouth  daily.   albuterol (VENTOLIN HFA) 108 (90 Base) MCG/ACT inhaler Inhale 2 puffs into the lungs every 6 (six) hours as needed for wheezing or shortness of breath. (Patient not taking: Reported on 12/27/2022)   No facility-administered encounter medications on file as of 12/27/2022.    Past Medical History:  Diagnosis Date   ADHD (attention deficit hyperactivity disorder)    Asthma    triggered by sensoanl allergies   Celiac disease    Dysphagia    Started with choking incident at school   Relapsing polychondritis    Seasonal allergies    Spring    Past Surgical History:  Procedure Laterality Date   ESOPHAGOGASTRODUODENOSCOPY  03/27/2012   Procedure: ESOPHAGOGASTRODUODENOSCOPY (EGD);  Surgeon: Jon Gills, MD;  Location: [facility] OR;  Service: Gastroenterology;  Laterality: N/A;  possible dilatation   ORIF ELBOW FRACTURE Right 06/27/2014   Procedure: OPEN REDUCTION INTERNAL FIXATION (ORIF) ELBOW/OLECRANON FRACTURE;  Surgeon: Bradly Bienenstock, MD;  Location: [facility] OR;  Service: Orthopedics;  Laterality: Right;    Family History  Problem Relation Age of Onset   Hyperlipidemia Mother    Hypertension Mother    Diabetes Mother    Dementia Maternal Grandmother    Diabetes Maternal Grandfather    Hyperlipidemia Maternal Grandfather    Hypertension Maternal Grandfather    Stroke Maternal Grandfather    Heart disease Paternal Grandfather    Heart attack Paternal Grandfather  Social History   Socioeconomic History   Marital status: Single    Spouse name: Not on file   Number of children: 0   Years of education: Not on file   Highest education level: Not on file  Occupational History   Not on file  Tobacco Use   Smoking status: Former    Current packs/day: 0.00    Types: Cigarettes, E-cigarettes    Quit date: 08/30/2015    Years since quitting: 7.3   Smokeless tobacco: Former    Types: Chew    Quit date: 09/20/2014   Tobacco comments:    Quit 4-5 years ago.   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  Respiratory:  Negative for shortness of breath.   Cardiovascular:  Negative for chest pain and leg swelling.  Gastrointestinal:  Negative for abdominal pain, blood in stool, constipation, diarrhea, nausea and vomiting.       BM daily   Genitourinary:  Negative for dysuria and hematuria.  Neurological:  Positive for tremors. Negative for tingling and headaches.  Psychiatric/Behavioral:  Negative for hallucinations and suicidal ideas.         Objective    BP 112/60   Pulse 63   Temp 98.4 F (36.9 C) (Temporal)   Ht 5\' 9"  (1.753 m)   Wt 130 lb 6.4 oz (59.1 kg)   SpO2 96%   BMI 19.26 kg/m   Physical Exam Vitals and nursing note reviewed. Exam conducted with a chaperone present Melina Copa, CMA).  Constitutional:      Appearance: Normal appearance.  HENT:     Right Ear: Tympanic membrane, ear canal and external ear normal.     Left Ear: Tympanic membrane, ear canal and external ear normal.     Mouth/Throat:     Mouth: Mucous membranes are moist.     Pharynx: Oropharynx is clear.  Eyes:      Extraocular Movements: Extraocular movements intact.     Pupils: Pupils are equal, round, and reactive to light.  Cardiovascular:     Rate and Rhythm: Normal rate and regular rhythm.     Pulses: Normal pulses.     Heart sounds: Normal heart sounds.  Pulmonary:     Effort: Pulmonary effort is normal.     Breath sounds: Normal breath sounds.  Abdominal:     General: Bowel sounds are normal. There is no distension.  Palpations: There is no mass.     Tenderness: There is abdominal tenderness.     Hernia: No hernia is present. There is no hernia in the left inguinal area or right inguinal area.  Genitourinary:    Penis: Normal.      Testes: Normal.     Epididymis:     Right: Normal.     Left: Normal.  Musculoskeletal:     Right lower leg: No edema.     Left lower leg: No edema.  Lymphadenopathy:     Cervical: No cervical adenopathy.     Lower Body: No right inguinal adenopathy. No left inguinal adenopathy.  Skin:    General: Skin is warm.  Neurological:     General: No focal deficit present.     Mental Status: He is alert.     Deep Tendon Reflexes:     Reflex Scores:      Bicep reflexes are 2+ on the right side and 2+ on the left side.      Patellar reflexes are 2+ on the right side and 2+ on the left side.    Comments: Bilateral upper and lower extremity strength 5/5  Psychiatric:        Mood and Affect: Mood normal.        Behavior: Behavior normal.        Thought Content: Thought content normal.        Judgment: Judgment normal.         Assessment & Plan:   Problem List Items Addressed This Visit       Respiratory   Asthma    Childhood diagnosis.  Patient has no PND or albuterol use.  Stable      Relevant Medications   predniSONE (DELTASONE) 20 MG tablet     Digestive   Celiac disease    Diagnosis through endoscopy.  Patient avoids gluten        Other   Preventative health care - Primary    Discussed age-appropriate immunization screening  exams.  Did review patient's personal, surgical, social, family histories.  Patient up-to-date on all age-appropriate vaccinations he would like.  Discussed the flu vaccine he like to defer for now.  Discussed HPV vaccine he would like information information given at discharge.  Patient is too young for CRC screening, prostate cancer screening.  Patient declines STI testing as he states him and his girlfriend got tested before they started having intercourse and they are monogamous.  Patient was given information at discharge about preventative healthcare maintenance with anticipatory guidance.      Epigastric pain    Likely secondary to a overproduction of stomach acid.  Will do omeprazole 20 mg daily for 1 to 3 months.  Patient will follow up and reach out if not no improvement after a month.      Relevant Medications   omeprazole (PRILOSEC) 20 MG capsule    Return in about 1 year (around 12/27/2023) for CPE and Labs.   Audria Nine, NP

## 2022-12-27 NOTE — Assessment & Plan Note (Signed)
Discussed age-appropriate immunization screening exams.  Did review patient's personal, surgical, social, family histories.  Patient up-to-date on all age-appropriate vaccinations he would like.  Discussed the flu vaccine he like to defer for now.  Discussed HPV vaccine he would like information information given at discharge.  Patient is too young for CRC screening, prostate cancer screening.  Patient declines STI testing as he states him and his girlfriend got tested before they started having intercourse and they are monogamous.  Patient was given information at discharge about preventative healthcare maintenance with anticipatory guidance.

## 2022-12-27 NOTE — Assessment & Plan Note (Signed)
Childhood diagnosis.  Patient has no PND or albuterol use.  Stable

## 2022-12-27 NOTE — Assessment & Plan Note (Signed)
Likely secondary to a overproduction of stomach acid.  Will do omeprazole 20 mg daily for 1 to 3 months.  Patient will follow up and reach out if not no improvement after a month.

## 2023-03-31 ENCOUNTER — Ambulatory Visit (HOSPITAL_COMMUNITY)
Admission: EM | Admit: 2023-03-31 | Discharge: 2023-03-31 | Disposition: A | Discharge status: Home / Self Care | Payer: 59

## 2023-03-31 DIAGNOSIS — R4589 Other symptoms and signs involving emotional state: Secondary | ICD-10-CM

## 2023-03-31 DIAGNOSIS — F32A Depression, unspecified: Secondary | ICD-10-CM | POA: Diagnosis not present

## 2023-03-31 NOTE — ED Provider Notes (Signed)
Behavioral Health Urgent Care Medical Screening Exam  Patient Name: Melvin Mitchell. Reuther MRN: [id number] Date of Evaluation: 04/01/23 Chief Complaint:   Diagnosis:  Final diagnoses:  Depression, unspecified depression type  Ineffective coping  Emotional distress    History of Present illness: Melvin Mitchell is a 27 y.o. male. With no confirm psychiatric diagnosis,  presented to [facility] accompanied by his father.  Per the patient he is trying to figure out how to deal with his emotions he is not able to sleep lately even though he try over-the-counter supplements.  When asked what is causing this stress patient stated that his ex left a couple weeks ago and the note says she is on social media posting about other things.  Patient reports he lives alone, currently works full time.  Patient reports that he does have suicidal thoughts a couple days ago but had no plans.  When asked if he is suicidal today patient stated no but he does not want to hurt himself.  Per the patient when he was with his ex she would talk down to him and in turn saying that he was motivating him to do better.  According to the patient he is trying to get some outpatient therapy resources, so he can learn how to deal with his emotions moving forward.  Patient denies currently prescribed any medications, denies currently having a therapist or a psychiatrist or ever being diagnosed with any psychiatric problems.  Patient denies any prior suicidal ideations.  Face-to-face evaluation of patient, patient is alert and oriented x 4, speech is clear, maintaining eye contact.  Patient answers all questions appropriately.  Patient is observed sitting in the room with his father beside him patient does appear to be a little bit anxious however patient is pleasant overall.  Patient denies current SI, HI, AVH or paranoia.  Reports he smoked marijuana on a daily basis but has tried to stop smoking for the past 8 days.  Patient also reports  that he used to drink socially but has not drunk anything in over 2 weeks.  Patient reported that he does own guns but his father who is accompanying patient stated that he will take the guns and he will put them in a safe.  Patient denies any plans to hurt himself or others.  Writer did speak with Dr Clovis Riley MD,  prior to discharging patient.   Flowsheet Row ED from 03/31/2023 in [facility] ED from 11/11/2022 in [facility] Emergency Department at [facility] ED from 05/24/2020 in [facility] at [facility])  C-SSRS RISK CATEGORY No Risk No Risk No Risk       Psychiatric Specialty Exam  Presentation  General Appearance:Casual  Eye Contact:Good  Speech:Clear and Coherent  Speech Volume:Normal  Handedness:Right   Mood and Affect  Mood: Anxious  Affect: Appropriate   Thought Process  Thought Processes: Coherent  Descriptions of Associations:Circumstantial  Orientation:Full (Time, Place and Person)  Thought Content:Logical    Hallucinations:None  Ideas of Reference:None  Suicidal Thoughts:No  Homicidal Thoughts:No   Sensorium  Memory: Immediate Good  Judgment: Good  Insight: Good   Executive Functions  Concentration: Good  Attention Span: Good  Recall: Good  Fund of Knowledge: Good  Language: Good   Psychomotor Activity  Psychomotor Activity: Normal   Assets  Assets: Desire for Improvement; Resilience   Sleep  Sleep: Fair  Number of hours:  5   Physical Exam: Physical Exam HENT:  Head: Normocephalic.     Nose: Nose normal.  Eyes:     Pupils: Pupils are equal, round, and reactive to light.  Cardiovascular:     Rate and Rhythm: Normal rate.  Pulmonary:     Effort: Pulmonary effort is normal.  Musculoskeletal:        General: Normal range of motion.     Cervical back: Normal range of motion.  Neurological:     General: No focal deficit present.      Mental Status: He is alert.  Psychiatric:        Mood and Affect: Mood normal.        Thought Content: Thought content normal.    Review of Systems  Constitutional: Negative.   HENT: Negative.    Eyes: Negative.   Respiratory: Negative.    Cardiovascular: Negative.   Gastrointestinal: Negative.   Genitourinary: Negative.   Musculoskeletal: Negative.   Skin: Negative.   Neurological: Negative.   Psychiatric/Behavioral:  Positive for depression. The patient is nervous/anxious and has insomnia.    Blood pressure 125/89, pulse 89, temperature 98.7 F (37.1 C), temperature source Oral, resp. rate 19, SpO2 100%. There is no height or weight on file to calculate BMI.  Musculoskeletal: Strength & Muscle Tone: within normal limits Gait & Station: normal Patient leans: N/A   [facility] MSE Discharge Disposition for Follow up and Recommendations: Based on my evaluation the patient does not appear to have an emergency medical condition and can be discharged with resources and follow up care in outpatient services for Individual Therapy   Sindy Guadeloupe, NP 04/01/2023, 5:54 AM

## 2023-03-31 NOTE — Progress Notes (Signed)
   03/31/23 1822  [facility] Triage Screening (Walk-ins at [facility] only)  How Did You Hear About [place]? Family/Friend  What Is the Reason for Your Visit/Call Today? Melvin Mitchell is a 27 year old male presenting to [facility] accompanied by his father. Pt reports that he is going through an "intense" break up. Pt mentions he has severe depression due to this breakup. Pt reports passive Si thoughts, last thought was today. Pt does not currently have a plan to hurt himself or end his life at this time. No past suicide attempts reported. Pt is currently looking for a therapist at this time to help him process his emotions. Pt denies substance use, Si, Hi and Avh.  How Long Has This Been Causing You Problems? <Week  Have You Recently Had Any Thoughts About Hurting Yourself? Yes  How long ago did you have thoughts about hurting yourself? today  Are You Planning to Commit Suicide/Harm Yourself At This time? No  Have you Recently Had Thoughts About Hurting Someone [name]? No  Are You Planning To Harm Someone At This Time? No  Physical Abuse Denies  Verbal Abuse Denies  Sexual Abuse Denies  Exploitation of patient/patient's resources Denies  Self-Neglect Denies  Possible abuse reported to: Other (Comment)  Are you currently experiencing any auditory, visual or other hallucinations? No  Have You Used Any Alcohol or Drugs in the Past 24 Hours? No  Do you have any current medical co-morbidities that require immediate attention? No  Clinician description of patient physical appearance/behavior: calm, cooperative  What Do You Feel Would Help You the Most Today? Stress Management  If access to [facility] was not available, would you have sought care in the Emergency Department? No  Determination of Need Routine (7 days)  Options For Referral Intensive Outpatient Therapy

## 2023-03-31 NOTE — Discharge Instructions (Signed)
F/u with therapy resources given F/u with walk-in psychiatric

## 2023-04-01 ENCOUNTER — Ambulatory Visit (INDEPENDENT_AMBULATORY_CARE_PROVIDER_SITE_OTHER): Payer: 59 | Admitting: Student

## 2023-04-01 ENCOUNTER — Ambulatory Visit (HOSPITAL_COMMUNITY): Payer: 59 | Admitting: Student

## 2023-04-01 ENCOUNTER — Encounter (HOSPITAL_COMMUNITY): Payer: Self-pay | Admitting: Student

## 2023-04-01 DIAGNOSIS — F324 Major depressive disorder, single episode, in partial remission: Secondary | ICD-10-CM | POA: Insufficient documentation

## 2023-04-01 DIAGNOSIS — M941 Relapsing polychondritis: Secondary | ICD-10-CM | POA: Insufficient documentation

## 2023-04-01 DIAGNOSIS — F4310 Post-traumatic stress disorder, unspecified: Secondary | ICD-10-CM | POA: Insufficient documentation

## 2023-04-01 DIAGNOSIS — F322 Major depressive disorder, single episode, severe without psychotic features: Secondary | ICD-10-CM | POA: Diagnosis not present

## 2023-04-01 HISTORY — DX: Major depressive disorder, single episode, severe without psychotic features: F32.2

## 2023-04-01 MED ORDER — ESCITALOPRAM OXALATE 10 MG PO TABS: 10.0000 mg | ORAL_TABLET | Freq: Every day | ORAL | 0 refills | Status: DC
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
## 2023-04-06 NOTE — Addendum Note (Signed)
Addended by: Theodoro Kos A on: 04/06/2023 03:58 PM   Modules accepted: Level of Service

## 2023-04-07 ENCOUNTER — Telehealth (HOSPITAL_COMMUNITY): Payer: Self-pay | Admitting: Student

## 2023-04-07 NOTE — Telephone Encounter (Signed)
Able to get in contact with patient for safety check in. Denied active or passive SI, since before our first encounter.  Again confirmed that safety planning is complete.  No need for additional check ups at this time.  He was provided info for the urgent care, 911, [phone].

## 2023-04-14 ENCOUNTER — Telehealth (HOSPITAL_COMMUNITY): Payer: Self-pay | Admitting: *Deleted

## 2023-04-14 ENCOUNTER — Encounter (HOSPITAL_COMMUNITY): Payer: Self-pay | Admitting: Student

## 2023-04-14 DIAGNOSIS — F4310 Post-traumatic stress disorder, unspecified: Secondary | ICD-10-CM

## 2023-04-14 DIAGNOSIS — F322 Major depressive disorder, single episode, severe without psychotic features: Secondary | ICD-10-CM

## 2023-04-14 MED ORDER — MIRTAZAPINE 7.5 MG PO TABS: 7.5000 mg | ORAL_TABLET | Freq: Every day | ORAL | 0 refills | Status: DC

## 2023-04-14 NOTE — Telephone Encounter (Signed)
Pt called stating that since starting Lexapro he has been having "trouble sleeping". Per your note from initial visit on 04/01/23, pt had been experiencing insomnia previous to beginning Lexapro 10 mg every day. Pt says he has trouble falling and staying asleep. He denies S.I. and states "I'm just really frustrated and angry about some things". Pt has a f/u scheduled for 04/30/23. please review.

## 2023-04-14 NOTE — Telephone Encounter (Signed)
Got in contact with patient. Discussed his concerns and will start remeron as discussed during our initial encounter, since he has been on lexapro alone for about 2 weeks now. Does have some GI side effects, so expect that remeron will help.  Discussed sleep hygiene.   Denied active and passive SI, HI, AVH.   Had no other questions or concerns.

## 2023-04-28 ENCOUNTER — Encounter (HOSPITAL_COMMUNITY): Payer: Self-pay | Admitting: Student

## 2023-04-28 ENCOUNTER — Other Ambulatory Visit (HOSPITAL_COMMUNITY): Payer: Self-pay | Admitting: Student

## 2023-04-28 DIAGNOSIS — F322 Major depressive disorder, single episode, severe without psychotic features: Secondary | ICD-10-CM

## 2023-04-28 DIAGNOSIS — F4310 Post-traumatic stress disorder, unspecified: Secondary | ICD-10-CM

## 2023-04-28 MED ORDER — ESCITALOPRAM OXALATE 10 MG PO TABS: 10.0000 mg | ORAL_TABLET | Freq: Every day | ORAL | 0 refills | Status: DC

## 2023-04-28 MED ORDER — MIRTAZAPINE 7.5 MG PO TABS: 7.5000 mg | ORAL_TABLET | Freq: Every day | ORAL | 0 refills | Status: DC

## 2023-04-30 ENCOUNTER — Ambulatory Visit (HOSPITAL_COMMUNITY): Payer: 59 | Admitting: Student

## 2023-05-07 ENCOUNTER — Ambulatory Visit (HOSPITAL_BASED_OUTPATIENT_CLINIC_OR_DEPARTMENT_OTHER): Payer: 59 | Admitting: Student

## 2023-05-07 DIAGNOSIS — F322 Major depressive disorder, single episode, severe without psychotic features: Secondary | ICD-10-CM

## 2023-05-07 DIAGNOSIS — F1010 Alcohol abuse, uncomplicated: Secondary | ICD-10-CM | POA: Diagnosis not present

## 2023-05-07 DIAGNOSIS — F1290 Cannabis use, unspecified, uncomplicated: Secondary | ICD-10-CM | POA: Diagnosis not present

## 2023-05-07 DIAGNOSIS — F4310 Post-traumatic stress disorder, unspecified: Secondary | ICD-10-CM | POA: Diagnosis not present
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
## 2023-05-09 ENCOUNTER — Encounter (HOSPITAL_COMMUNITY): Payer: Self-pay | Admitting: Student

## 2023-05-09 DIAGNOSIS — F1010 Alcohol abuse, uncomplicated: Secondary | ICD-10-CM | POA: Insufficient documentation

## 2023-05-09 DIAGNOSIS — F1290 Cannabis use, unspecified, uncomplicated: Secondary | ICD-10-CM | POA: Insufficient documentation

## 2023-05-09 DIAGNOSIS — F1211 Cannabis abuse, in remission: Secondary | ICD-10-CM | POA: Insufficient documentation

## 2023-05-14 ENCOUNTER — Encounter (HOSPITAL_COMMUNITY): Payer: Self-pay | Admitting: Student

## 2023-05-14 ENCOUNTER — Ambulatory Visit (HOSPITAL_BASED_OUTPATIENT_CLINIC_OR_DEPARTMENT_OTHER): Payer: 59 | Admitting: Student

## 2023-05-14 DIAGNOSIS — F1020 Alcohol dependence, uncomplicated: Secondary | ICD-10-CM | POA: Diagnosis not present

## 2023-05-14 DIAGNOSIS — F1290 Cannabis use, unspecified, uncomplicated: Secondary | ICD-10-CM

## 2023-05-14 DIAGNOSIS — F322 Major depressive disorder, single episode, severe without psychotic features: Secondary | ICD-10-CM

## 2023-05-14 DIAGNOSIS — F4310 Post-traumatic stress disorder, unspecified: Secondary | ICD-10-CM

## 2023-05-14 MED ORDER — ESCITALOPRAM OXALATE 20 MG PO TABS: 20.0000 mg | ORAL_TABLET | Freq: Every day | ORAL | 0 refills | Status: DC

## 2023-05-14 MED ORDER — MIRTAZAPINE 7.5 MG PO TABS: 7.5000 mg | ORAL_TABLET | Freq: Every day | ORAL | 0 refills | Status: DC
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
## 2023-05-20 ENCOUNTER — Encounter (HOSPITAL_COMMUNITY): Payer: Self-pay | Admitting: Student

## 2023-05-20 ENCOUNTER — Other Ambulatory Visit (HOSPITAL_COMMUNITY): Payer: Self-pay | Admitting: Student

## 2023-05-20 DIAGNOSIS — E878 Other disorders of electrolyte and fluid balance, not elsewhere classified: Secondary | ICD-10-CM

## 2023-05-20 LAB — SPECIMEN STATUS

## 2023-05-22 LAB — COMPREHENSIVE METABOLIC PANEL
ALT: 14 [IU]/L (ref 0–44)
AST: 19 [IU]/L (ref 0–40)
Albumin: 5 g/dL (ref 4.3–5.2)
Alkaline Phosphatase: 73 [IU]/L (ref 44–121)
BUN/Creatinine Ratio: 18 (ref 9–20)
BUN: 19 mg/dL (ref 6–20)
Bilirubin Total: 0.5 mg/dL (ref 0.0–1.2)
CO2: 22 mmol/L (ref 20–29)
Calcium: 10.2 mg/dL (ref 8.7–10.2)
Chloride: 103 mmol/L (ref 96–106)
Creatinine, Ser: 1.07 mg/dL (ref 0.76–1.27)
Globulin, Total: 2 g/dL (ref 1.5–4.5)
Glucose: 66 mg/dL — ABNORMAL LOW (ref 70–99)
Potassium: 6 mmol/L — ABNORMAL HIGH (ref 3.5–5.2)
Sodium: 145 mmol/L — ABNORMAL HIGH (ref 134–144)
Total Protein: 7 g/dL (ref 6.0–8.5)
eGFR: 99 mL/min/{1.73_m2} (ref >59)

## 2023-05-22 LAB — B12 AND FOLATE PANEL
Folate: 13.9 ng/mL (ref >3.0)
Vitamin B-12: 733 pg/mL (ref 232–1245)

## 2023-05-22 LAB — VITAMIN D 25 HYDROXY (VIT D DEFICIENCY, FRACTURES): Vit D, 25-Hydroxy: 32.4 ng/mL (ref 30.0–100.0)

## 2023-05-22 LAB — SPECIMEN STATUS REPORT

## 2023-05-26 ENCOUNTER — Ambulatory Visit
Admission: EM | Admit: 2023-05-26 | Discharge: 2023-05-26 | Disposition: A | Discharge status: Home / Self Care | Payer: 59 | Attending: Family Medicine | Admitting: Family Medicine

## 2023-05-26 DIAGNOSIS — J111 Influenza due to unidentified influenza virus with other respiratory manifestations: Secondary | ICD-10-CM | POA: Diagnosis not present

## 2023-05-26 MED ORDER — OSELTAMIVIR PHOSPHATE 75 MG PO CAPS: 75.0000 mg | ORAL_CAPSULE | Freq: Two times a day (BID) | ORAL | 0 refills | Status: DC

## 2023-05-26 NOTE — ED Provider Notes (Incomplete)
[facility]    CSN: [id number] Arrival date & time: 05/26/23  1228      History   Chief Complaint Chief Complaint  Patient presents with   Cough    HPI Melvin Mitchell is a 28 y.o. male.   HPI Patient here today with 2 days of cough, generalized bodyaches, fever, headache.  Patient is currently febrile on arrival with a temp of 101  Past Medical History:  Diagnosis Date   Acute otitis externa of both ears 11/26/2019   ADHD (attention deficit hyperactivity disorder)    Asthma    triggered by sensoanl allergies   Celiac disease    Dysphagia    Started with choking incident at school   MDD (major depressive episode), single episode, severe, no psychosis, anxious distress (HCC) 04/01/2023   Relapsing polychondritis    Seasonal allergies    Spring   Unintentional weight loss 12/26/2015    Patient Active Problem List   Diagnosis Date Noted   Alcohol use disorder, mild, abuse 05/09/2023   Cannabis use disorder 05/09/2023   Relapsing polychondritis 04/01/2023   MDD (major depressive episode), single episode, severe, no psychosis, anxious distress (HCC) 04/01/2023   PTSD (post-traumatic stress disorder) 04/01/2023   Preventative health care 12/27/2022   Epigastric pain 12/27/2022   Conductive hearing loss of right ear with unrestricted hearing of left ear 11/26/2019   Hearing loss of right ear due to cerumen impaction 11/26/2019   Asthma 09/21/2015   Acute rhinosinusitis 09/20/2015   Cough 09/20/2015   Right Monteggia fracture 06/26/2014   Childhood celiac disease 04/16/2012   Difficulty swallowing     Past Surgical History:  Procedure Laterality Date   ESOPHAGOGASTRODUODENOSCOPY  03/27/2012   Procedure: ESOPHAGOGASTRODUODENOSCOPY (EGD);  Surgeon: Jon Gills, MD;  Location: [facility] OR;  Service: Gastroenterology;  Laterality: N/A;  possible dilatation   ORIF ELBOW FRACTURE Right 06/27/2014   Procedure: OPEN REDUCTION INTERNAL FIXATION (ORIF)  ELBOW/OLECRANON FRACTURE;  Surgeon: Bradly Bienenstock, MD;  Location: [facility] OR;  Service: Orthopedics;  Laterality: Right;       Home Medications    Prior to Admission medications   Medication Sig Start Date End Date Taking? Authorizing Provider  escitalopram (LEXAPRO) 20 MG tablet Take 1 tablet (20 mg total) by mouth at bedtime. 05/14/23 08/12/23 Yes Princess Bruins, DO  albuterol (VENTOLIN HFA) 108 (90 Base) MCG/ACT inhaler Inhale 2 puffs into the lungs every 6 (six) hours as needed for wheezing or shortness of breath. Patient not taking: Reported on 12/27/2022 12/13/20   Waldon Merl, PA-C  benzonatate (TESSALON) 100 MG capsule Take 1 capsule (100 mg total) by mouth 3 (three) times daily as needed for cough. 12/13/20   Waldon Merl, PA-C  mirtazapine (REMERON) 7.5 MG tablet Take 1 tablet (7.5 mg total) by mouth at bedtime. 05/14/23 08/12/23  Princess Bruins, DO  omeprazole (PRILOSEC) 20 MG capsule Take 1 capsule (20 mg total) by mouth daily. 12/27/22   Eden Emms, NP  predniSONE (DELTASONE) 20 MG tablet 1 tablet Orally Once a day for 5 days 10/28/19   [provider]    Family History Family History  Problem Relation Age of Onset   Hyperlipidemia Mother    Hypertension Mother    Diabetes Mother    Dementia Maternal Grandmother    Diabetes Maternal Grandfather    Hyperlipidemia Maternal Grandfather    Hypertension Maternal Grandfather    Stroke Maternal Grandfather    Heart disease Paternal Grandfather  Heart attack Paternal Grandfather     Social History Social History   Tobacco Use   Smoking status: Former    Current packs/day: 0.00    Types: Cigarettes, E-cigarettes    Quit date: 2022    Years since quitting: 3.0    Passive exposure: Past   Smokeless tobacco: Former    Types: Chew    Quit date: 09/20/2014   Tobacco comments:    - quit 2022    - smoked since 28yo -> cigarettes, then vapes, then pouch, chewing.  Stopped altogether.  Vaping Use   Vaping status:  Former   Quit date: 06/09/2019  Substance Use Topics   Alcohol use: Not Currently    Comment: Started 28yo. 28yo 4-2mo heavy daily drinking. 2022-2024 binge 2x/mo had some w/d tremors, no other w/d sxs.   Drug use: Not Currently    Types: Marijuana, "Crack" cocaine, Benzodiazepines, Cocaine, LSD, MDMA (Ecstacy), PCP    Comment: weed near daily since 28yo, last time 02/2023     Allergies   Patient has no known allergies.   Review of Systems Review of Systems   Physical Exam Triage Vital Signs ED Triage Vitals  Encounter Vitals Group     BP 05/26/23 1321 110/73     Systolic BP Percentile --      Diastolic BP Percentile --      Pulse Rate 05/26/23 1321 (!) 110     Resp 05/26/23 1321 18     Temp 05/26/23 1321 (!) 101 F (38.3 C)     Temp Source 05/26/23 1321 Axillary     SpO2 05/26/23 1321 96 %     Weight --      Height --      Head Circumference --      Peak Flow --      Pain Score 05/26/23 1319 0     Pain Loc --      Pain Education --      Exclude from Growth Chart --    No data found.  Updated Vital Signs BP 110/73 (BP Location: Left Arm)   Pulse (!) 110   Temp (!) 101 F (38.3 C) (Axillary)   Resp 18   SpO2 96%   Visual Acuity Right Eye Distance:   Left Eye Distance:   Bilateral Distance:    Right Eye Near:   Left Eye Near:    Bilateral Near:     Physical Exam   UC Treatments / Results  Labs (all labs ordered are listed, but only abnormal results are displayed) Labs Reviewed - No data to display  EKG   Radiology No results found.  Procedures Procedures (including critical care time)  Medications Ordered in UC Medications - No data to display  Initial Impression / Assessment and Plan / UC Course  I have reviewed the triage vital signs and the nursing notes.  Pertinent labs & imaging results that were available during my care of the patient were reviewed by me and considered in my medical decision making (see chart for details).      *** Final Clinical Impressions(s) / UC Diagnoses   Final diagnoses:  None   Discharge Instructions   None    ED Prescriptions   None    PDMP not reviewed this encounter.

## 2023-05-26 NOTE — Discharge Instructions (Signed)
You are considered contagious to others as long as you have a measurable fever with a temperature 100 F.  You should consider yourself infectious until being fever  free for 24 hours without fever lowering medications. Continue to alternate Tylenol and ibuprofen for management of fever.   Force fluids to maintain hydration. Tamiflu twice daily for the next 5 days to reduce symptoms and course of influenza virus.   If you develop any shortness of breath, wheezing or difficulty breathing go immediately to the nearest emergency department.

## 2023-05-26 NOTE — ED Triage Notes (Signed)
Pt presents to UC for c/o body aches, fever, cough x2 days. Used alkaseltzer cold and flu, tylenol

## 2023-05-27 ENCOUNTER — Ambulatory Visit: Payer: Self-pay | Admitting: Nurse Practitioner

## 2023-05-28 ENCOUNTER — Ambulatory Visit (HOSPITAL_COMMUNITY): Payer: 59 | Admitting: Student

## 2023-06-18 ENCOUNTER — Ambulatory Visit (HOSPITAL_BASED_OUTPATIENT_CLINIC_OR_DEPARTMENT_OTHER): Payer: 59 | Admitting: Student

## 2023-06-18 ENCOUNTER — Encounter (HOSPITAL_COMMUNITY): Payer: Self-pay | Admitting: Student

## 2023-06-18 DIAGNOSIS — F1020 Alcohol dependence, uncomplicated: Secondary | ICD-10-CM

## 2023-06-18 DIAGNOSIS — F4310 Post-traumatic stress disorder, unspecified: Secondary | ICD-10-CM | POA: Diagnosis not present

## 2023-06-18 DIAGNOSIS — F324 Major depressive disorder, single episode, in partial remission: Secondary | ICD-10-CM | POA: Diagnosis not present

## 2023-06-18 DIAGNOSIS — F1211 Cannabis abuse, in remission: Secondary | ICD-10-CM

## 2023-06-18 MED ORDER — PRAZOSIN HCL 1 MG PO CAPS
1.0000 mg | ORAL_CAPSULE | Freq: Every day | ORAL | 0 refills | Status: DC
Start: 2023-06-18 — End: 2023-07-16

## 2023-06-18 MED ORDER — ESCITALOPRAM OXALATE 20 MG PO TABS: 20.0000 mg | ORAL_TABLET | Freq: Every day | ORAL | 0 refills | Status: DC
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
## 2023-06-22 ENCOUNTER — Emergency Department (HOSPITAL_COMMUNITY)

## 2023-06-22 ENCOUNTER — Emergency Department (HOSPITAL_COMMUNITY)
Admission: EM | Admit: 2023-06-22 | Discharge: 2023-06-22 | Disposition: A | Discharge status: Home / Self Care | Attending: Emergency Medicine | Admitting: Emergency Medicine

## 2023-06-22 ENCOUNTER — Encounter (HOSPITAL_COMMUNITY): Payer: Self-pay

## 2023-06-22 DIAGNOSIS — S02401A Maxillary fracture, unspecified, initial encounter for closed fracture: Secondary | ICD-10-CM | POA: Diagnosis not present

## 2023-06-22 DIAGNOSIS — S0081XA Abrasion of other part of head, initial encounter: Secondary | ICD-10-CM | POA: Diagnosis not present

## 2023-06-22 DIAGNOSIS — T07XXXA Unspecified multiple injuries, initial encounter: Secondary | ICD-10-CM

## 2023-06-22 DIAGNOSIS — Y9229 Other specified public building as the place of occurrence of the external cause: Secondary | ICD-10-CM | POA: Insufficient documentation

## 2023-06-22 DIAGNOSIS — S0993XA Unspecified injury of face, initial encounter: Secondary | ICD-10-CM | POA: Diagnosis present

## 2023-06-22 LAB — CBC WITH DIFFERENTIAL/PLATELET
Abs Immature Granulocytes: 0.08 10*3/uL — ABNORMAL HIGH (ref 0.00–0.07)
Basophils Absolute: 0 10*3/uL (ref 0.0–0.1)
Basophils Relative: 0 %
Eosinophils Absolute: 0 10*3/uL (ref 0.0–0.5)
Eosinophils Relative: 0 %
HCT: 43.3 % (ref 39.0–52.0)
Hemoglobin: 14.9 g/dL (ref 13.0–17.0)
Immature Granulocytes: 1 %
Lymphocytes Relative: 9 %
Lymphs Abs: 1.1 10*3/uL (ref 0.7–4.0)
MCH: 32 pg (ref 26.0–34.0)
MCHC: 34.4 g/dL (ref 30.0–36.0)
MCV: 92.9 fL (ref 80.0–100.0)
Monocytes Absolute: 0.8 10*3/uL (ref 0.1–1.0)
Monocytes Relative: 6 %
Neutro Abs: 10.7 10*3/uL — ABNORMAL HIGH (ref 1.7–7.7)
Neutrophils Relative %: 84 %
Platelets: 241 10*3/uL (ref 150–400)
RBC: 4.66 MIL/uL (ref 4.22–5.81)
RDW: 12.9 % (ref 11.5–15.5)
WBC: 12.8 10*3/uL — ABNORMAL HIGH (ref 4.0–10.5)
nRBC: 0 % (ref 0.0–0.2)

## 2023-06-22 LAB — I-STAT CHEM 8, ED
BUN: 14 mg/dL (ref 6–20)
Calcium, Ion: 1.09 mmol/L — ABNORMAL LOW (ref 1.15–1.40)
Chloride: 108 mmol/L (ref 98–111)
Creatinine, Ser: 1.2 mg/dL (ref 0.61–1.24)
Glucose, Bld: 108 mg/dL — ABNORMAL HIGH (ref 70–99)
HCT: 43 % (ref 39.0–52.0)
Hemoglobin: 14.6 g/dL (ref 13.0–17.0)
Potassium: 3.6 mmol/L (ref 3.5–5.1)
Sodium: 142 mmol/L (ref 135–145)
TCO2: 22 mmol/L (ref 22–32)

## 2023-06-22 MED ORDER — MELOXICAM 7.5 MG PO TABS: 7.5000 mg | ORAL_TABLET | Freq: Every day | ORAL | 0 refills | Status: AC

## 2023-06-22 MED ORDER — AMOXICILLIN-POT CLAVULANATE 875-125 MG PO TABS: 1.0000 | ORAL_TABLET | Freq: Two times a day (BID) | ORAL | 0 refills | Status: AC

## 2023-06-22 MED ORDER — ONDANSETRON 4 MG PO TBDP
8.0000 mg | ORAL_TABLET | Freq: Once | ORAL | Status: AC
  Administered 2023-06-22: 8 mg via ORAL
  Filled 2023-06-22: qty 2

## 2023-06-22 MED ORDER — IOHEXOL 350 MG/ML SOLN
75.0000 mL | Freq: Once | INTRAVENOUS | Status: AC | PRN
  Administered 2023-06-22: 75 mL via INTRAVENOUS

## 2023-06-22 MED ORDER — KETOROLAC TROMETHAMINE 30 MG/ML IJ SOLN
30.0000 mg | Freq: Once | INTRAMUSCULAR | Status: AC
  Administered 2023-06-22: 30 mg via INTRAVENOUS
  Filled 2023-06-22: qty 1

## 2023-06-22 MED ORDER — MELOXICAM 7.5 MG PO TABS: 7.5000 mg | ORAL_TABLET | Freq: Every day | ORAL | 0 refills | Status: DC

## 2023-06-22 MED ORDER — AMOXICILLIN-POT CLAVULANATE 875-125 MG PO TABS: 1.0000 | ORAL_TABLET | Freq: Two times a day (BID) | ORAL | 0 refills | Status: DC
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
## 2023-06-22 NOTE — ED Notes (Signed)
 Patient transported to CT scan .

## 2023-06-22 NOTE — Discharge Instructions (Addendum)
 No nose blowing, do not pinch your nose.  Sleep with the head of the bed elevated.
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
## 2023-06-27 ENCOUNTER — Telehealth: Payer: Self-pay

## 2023-06-27 NOTE — Transitions of Care (Post Inpatient/ED Visit) (Signed)
 Unable to reach patient by phone and left v/m requesting call back at [phone].        06/27/2023  Name: Melvin Mitchell. Sebesta MRN: [id number] DOB: 1995-12-11  Today's TOC FU Call Status: Today's TOC FU Call Status:: Unsuccessful Call (1st Attempt) Unsuccessful Call (1st Attempt) Date: 06/27/23  Attempted to reach the patient regarding the most recent Inpatient/ED visit.  Follow Up Plan: Additional outreach attempts will be made to reach the patient to complete the Transitions of Care (Post Inpatient/ED visit) call.   Signature  Lewanda Rife, LPN

## 2023-07-16 ENCOUNTER — Other Ambulatory Visit (HOSPITAL_COMMUNITY): Payer: Self-pay | Admitting: Student

## 2023-07-16 ENCOUNTER — Ambulatory Visit (HOSPITAL_COMMUNITY): Payer: 59 | Admitting: Student

## 2023-07-16 DIAGNOSIS — F324 Major depressive disorder, single episode, in partial remission: Secondary | ICD-10-CM

## 2023-07-16 DIAGNOSIS — F4310 Post-traumatic stress disorder, unspecified: Secondary | ICD-10-CM

## 2023-07-16 DIAGNOSIS — F1020 Alcohol dependence, uncomplicated: Secondary | ICD-10-CM

## 2023-07-16 DIAGNOSIS — F1211 Cannabis abuse, in remission: Secondary | ICD-10-CM

## 2023-07-16 MED ORDER — PRAZOSIN HCL 1 MG PO CAPS: 1.0000 mg | ORAL_CAPSULE | Freq: Every day | ORAL | 0 refills | Status: AC

## 2023-07-16 MED ORDER — ESCITALOPRAM OXALATE 20 MG PO TABS: 20.0000 mg | ORAL_TABLET | Freq: Every day | ORAL | 0 refills | Status: AC

## 2023-07-16 NOTE — Telephone Encounter (Signed)
 Late to today's appointment unfortunately, sent in refills to bridge until next appointment

## 2023-08-04 ENCOUNTER — Ambulatory Visit (HOSPITAL_COMMUNITY): Admitting: Student

## 2023-08-04 ENCOUNTER — Encounter (HOSPITAL_COMMUNITY): Payer: Self-pay

## 2023-10-20 ENCOUNTER — Telehealth (HOSPITAL_COMMUNITY): Payer: Self-pay | Admitting: Student

## 2023-10-20 NOTE — Telephone Encounter (Signed)
 Patient was called to get scheduled with Dr. Kapoor for continuation of care. Patient stated he does not need to be seen for treatment anymore and denied needing an appointment scheduled. Patient stated he has not taken his medication in 3 months and feels fine. Patient was told to call out office to get scheduled if needed as well as information for urgent care and ED if needed.
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
## 2023-11-11 ENCOUNTER — Telehealth (HOSPITAL_COMMUNITY): Payer: Self-pay

## 2023-11-11 NOTE — Telephone Encounter (Signed)
 Patient called to cancel appointment and stated he does not need to see anyone he has not been taking any medication prescribed to him and states he is feeling better.Patient states he will not be scheduling any more appointmetns with our office

## 2023-11-12 ENCOUNTER — Ambulatory Visit (HOSPITAL_COMMUNITY)

## 2023-12-08 ENCOUNTER — Encounter: Payer: Self-pay | Admitting: Physical Medicine & Rehabilitation

## 2023-12-31 ENCOUNTER — Encounter: Attending: Physical Medicine & Rehabilitation | Admitting: Physical Medicine & Rehabilitation

## 2024-01-01 ENCOUNTER — Encounter: Payer: 59 | Admitting: Nurse Practitioner

## 2024-01-01 NOTE — Progress Notes (Deleted)
   Established Patient Office Visit  Subjective   Patient ID: Melvin Mitchell. [place], male    DOB: 1995/11/05  Age: 28 y.o. MRN: [id number]  No chief complaint on file.   HPI  {History (Optional):23778}  ROS    Objective:     There were no vitals taken for this visit. {Vitals History (Optional):23777}  Physical Exam   No results found for any visits on 01/01/24.  {Labs (Optional):23779}  The ASCVD Risk score (Arnett DK, et al., 2019) failed to calculate for the following reasons:   The 2019 ASCVD risk score is only valid for ages 82 to 57    Assessment & Plan:   Problem List Items Addressed This Visit   None   No follow-ups on file.    Adina Crandall, NP

## 2024-02-18 ENCOUNTER — Encounter: Admitting: Physical Medicine & Rehabilitation

## 2024-05-05 ENCOUNTER — Encounter: Payer: Self-pay | Attending: Physical Medicine & Rehabilitation | Admitting: Physical Medicine & Rehabilitation
